# Patient Record
Sex: Male | Born: 1975
Health system: Southern US, Community
[De-identification: ages and names within clinical notes are randomized; demographics above are authoritative.]

## PROBLEM LIST (undated history)

## (undated) DIAGNOSIS — Z91030 Bee allergy status: Secondary | ICD-10-CM

## (undated) DIAGNOSIS — I10 Essential (primary) hypertension: Secondary | ICD-10-CM

## (undated) DIAGNOSIS — E785 Hyperlipidemia, unspecified: Secondary | ICD-10-CM

## (undated) DIAGNOSIS — K219 Gastro-esophageal reflux disease without esophagitis: Secondary | ICD-10-CM

## (undated) DIAGNOSIS — J302 Other seasonal allergic rhinitis: Secondary | ICD-10-CM

## (undated) DIAGNOSIS — I214 Non-ST elevation (NSTEMI) myocardial infarction: Secondary | ICD-10-CM

## (undated) DIAGNOSIS — F419 Anxiety disorder, unspecified: Secondary | ICD-10-CM

## (undated) DIAGNOSIS — I2510 Atherosclerotic heart disease of native coronary artery without angina pectoris: Secondary | ICD-10-CM

## (undated) HISTORY — DX: Gastro-esophageal reflux disease without esophagitis: K21.9

## (undated) HISTORY — DX: Atherosclerotic heart disease of native coronary artery without angina pectoris: I25.10

## (undated) HISTORY — DX: Other seasonal allergic rhinitis: J30.2

## (undated) HISTORY — DX: Anxiety disorder, unspecified: F41.9

## (undated) HISTORY — DX: Hyperlipidemia, unspecified: E78.5

## (undated) HISTORY — DX: Essential (primary) hypertension: I10

## (undated) HISTORY — DX: Non-ST elevation (NSTEMI) myocardial infarction: I21.4

---

## 2001-05-21 ENCOUNTER — Emergency Department (HOSPITAL_COMMUNITY): Admission: EM | Admit: 2001-05-21 | Discharge: 2001-05-21 | Payer: Self-pay | Admitting: Emergency Medicine

## 2004-03-29 ENCOUNTER — Emergency Department (HOSPITAL_COMMUNITY): Admission: EM | Admit: 2004-03-29 | Discharge: 2004-03-29 | Payer: Self-pay | Admitting: Emergency Medicine

## 2004-04-04 ENCOUNTER — Ambulatory Visit (HOSPITAL_COMMUNITY): Admission: RE | Admit: 2004-04-04 | Discharge: 2004-04-04 | Payer: Self-pay | Admitting: Family Medicine

## 2007-11-12 ENCOUNTER — Ambulatory Visit (HOSPITAL_COMMUNITY): Admission: RE | Admit: 2007-11-12 | Discharge: 2007-11-12 | Payer: Self-pay | Admitting: Family Medicine

## 2007-12-24 ENCOUNTER — Ambulatory Visit: Payer: Self-pay | Admitting: Cardiovascular Disease

## 2008-03-08 ENCOUNTER — Emergency Department (HOSPITAL_COMMUNITY): Admission: EM | Admit: 2008-03-08 | Discharge: 2008-03-08 | Payer: Self-pay | Admitting: Emergency Medicine

## 2008-03-24 ENCOUNTER — Ambulatory Visit: Payer: Self-pay | Admitting: Cardiovascular Disease

## 2008-03-24 DIAGNOSIS — F411 Generalized anxiety disorder: Secondary | ICD-10-CM | POA: Insufficient documentation

## 2008-03-24 DIAGNOSIS — E785 Hyperlipidemia, unspecified: Secondary | ICD-10-CM | POA: Insufficient documentation

## 2008-03-24 DIAGNOSIS — I059 Rheumatic mitral valve disease, unspecified: Secondary | ICD-10-CM | POA: Insufficient documentation

## 2008-03-24 DIAGNOSIS — I10 Essential (primary) hypertension: Secondary | ICD-10-CM

## 2008-06-25 ENCOUNTER — Emergency Department (HOSPITAL_COMMUNITY): Admission: EM | Admit: 2008-06-25 | Discharge: 2008-06-25 | Payer: Self-pay | Admitting: Emergency Medicine

## 2008-08-10 ENCOUNTER — Ambulatory Visit (HOSPITAL_COMMUNITY): Admission: RE | Admit: 2008-08-10 | Discharge: 2008-08-10 | Payer: Self-pay | Admitting: Orthopedic Surgery

## 2008-10-06 ENCOUNTER — Encounter (INDEPENDENT_AMBULATORY_CARE_PROVIDER_SITE_OTHER): Payer: Self-pay | Admitting: *Deleted

## 2009-04-19 ENCOUNTER — Emergency Department (HOSPITAL_COMMUNITY): Admission: EM | Admit: 2009-04-19 | Discharge: 2009-04-19 | Payer: Self-pay | Admitting: Emergency Medicine

## 2010-01-26 ENCOUNTER — Emergency Department (HOSPITAL_COMMUNITY): Admission: EM | Admit: 2010-01-26 | Discharge: 2009-11-07 | Payer: Self-pay | Admitting: Emergency Medicine

## 2010-03-12 ENCOUNTER — Encounter: Payer: Self-pay | Admitting: Family Medicine

## 2010-07-04 NOTE — Assessment & Plan Note (Signed)
[facility]                            CARDIOLOGY OFFICE NOTE   NAME:Knoedler, [name]                         MRN:          [id number]  DATE:03/24/2008                            DOB:          10/12/1975    Ryan [name], Ryan [name], Ryan [name], Ryan mitral valve prolapse.   He has been doing well.  He continues to work in the [employer] with his father.  His weight continues to be high.   He had a blood pressure record with him Ryan continues to run systolics  of 131-150.   Since he has not lost weight, I told Ryan that he would probably need to  start blood pressure medicine.  He is okay with this.  We are going to  over low-sodium diet.   The patient has recently had bronchitis Ryan URI.  He has been seeing Dr.  Phillips Odor for this.  He continues to have some congestion.  For some  reason, they told him to stop his Crestor while he was taking a Z-Pak  Ryan some codeine.  I am not sure why.  He needs to be on cholesterol  medicine.   The patient was requesting something less expensive than Crestor.   REVIEW OF SYSTEMS:  Remarkable for congestion Ryan a cough from his URI.  He has not had any significant myalgias.  No headaches, no TIA, or CVA.  No lower extremity edema, chest pain, PND or orthopnea, Ryan only mild  shortness of breath from his URI.   CURRENT MEDICATIONS:  1. Fish oil.  2. Vitamin C.  3. B6.  4. B12.  5. Vitamin E.  6. Crestor 5 to be stopped.  7. Fexofenadine.  8. Aspirin.   PHYSICAL EXAMINATION:  GENERAL:  Remarkable for an overweight male in no  distress.  VITAL SIGNS:  His blood pressure is 150/84; weight 189; pulse 88,  afebrile; Ryan respiratory rate 14.  HEENT:  Unremarkable.  NECK:  Carotids are normal without bruit.  No lymphadenopathy,  thyromegaly, or JVP elevation.  LUNGS:  Clear, good diaphragmatic motion.  No wheezing.  CARDIAC:  S1 Ryan S2.   Normal heart sounds.  PMI normal.  ABDOMEN:  Benign.  Bowel sounds positive.  No AAA.  No tenderness.  No  bruit.  No hepatosplenomegaly.  No hepatojugular reflux.  EXTREMITIES:  Distal pulses are intact.  No edema.  NEURO:  Nonfocal.  SKIN:  Warm Ryan dry.  MUSCULOSKELETAL:  No muscular weakness.   IMPRESSION:  1. Recent bronchitis Ryan upper respiratory illness.  Continue      supportive care with decongestions.  Follow up with Dr. Phillips Odor.  2. Ryan [name].  Start lisinopril 10 mg a day, low-sodium diet.  3. [name].  Switch to Zocor 20 mg a day.  I asked him to      start his lisinopril first Ryan after 4 weeks if he had no side      effects, then restart his cholesterol medicine.  4. Ryan [name], seems improved.  Continue behavioral modifications.  No  need for selective serotonin reuptake inhibitors at this point.  5. History of mitral valve prolapse which revealed mild mitral      regurgitation.  Consider followup echo in November.  No need for      subacute bacterial endocarditis prophylaxis.     Noralyn Pick. Eden Emms, MD, [facility]  Electronically Signed    PCN/MedQ  DD: 03/24/2008  DT: 03/24/2008  Job #: [id number]

## 2010-07-04 NOTE — Assessment & Plan Note (Signed)
HEALTHCARE                            CARDIOLOGY OFFICE NOTE   NAME:[name], [name]                         MRN:          [id number]  DATE:12/24/2007                            DOB:          [birth year]    Mr. Ryan [name] seen today as a new patient at the request of Dr. Phillips Odor.  He  is 35 years old.  I have taken care of his father for a long time, who  has had bypass surgery.  Ryan is extremely worried about his risk  factors.  He was recently seen by [facility].  He did  not particularly get along with [facility].  I read through all of  the notes from [facility].   The patient is requesting a second opinion regarding an abnormal echo  with mitral valve disease, hypertension, and hypercholesterolemia.   In talking to Ryan, he is fairly asymptomatic.  He is not having chest  pain, PND, or orthopnea.  He is overweight.  He is not particularly  active with his exercising.  He was started on Crestor 5 mg a day by Dr.  Domingo Sep about 3 weeks ago.  He needs to have followup LFTs.  I told him  to get these done at Dr. Lamar Blinks office since he lives in [place]  and this is most convenient.  He is not having any myalgias or side  effects.   The patient's blood pressure has been borderline.  He needs to get a  blood pressure cuff and review his blood pressures at home.  It appears  that it was elevated at Dr. Roque Lias office and was elevated here  today.  I talked to him at length about his diet.  I told him that the  [facility] or ferment type diet would be appropriate for him.  He needs  to limit his sodium intake.   I explained to him in the long run that he probably would not need  cholesterol or hypertensive medicines if he could lose 10-15 pounds, be  more active, and watch his diet.   From a cardiovascular perspective, he had a stress Myoview study at Dr.  Roque Lias office.  The results were reviewed.  He  exercised 11 minutes  and achieved a maximum heart rate of 178.  EKG and Myoview images were  normal.   He also had a 2-D echocardiogram.  There was mild prolapse of the  anterior leaflet with trace mitral insufficiency.  I explained to Ryan  that this is not a clinically significant finding and that he did not  need SBE prophylaxis.   His EF on Myoview and echo were normal.   REVIEW OF SYSTEMS:  Otherwise negative.   PAST MEDICAL HISTORY:  Benign.  He has some allergies and some asthma.   The patient is happily married.  He had 2 children that are 87 and 92  years old.  His wife is quite organized regarding his medical care.  He  quit smoking in 2004.  He drinks once in a while, nothing regularly.  Father has premature coronary artery disease.  Mother and father are  still alive.  Father has started having coronary artery disease at age  46 and I have taken care of him for about 14 years.   CURRENT MEDICATIONS:  1. Fish oil.  2. Vitamin C, B6, and B12.  3. Crestor 5 a day.  4. Fexofenadine.  5. Xopenex.  6. Baby aspirin.   PHYSICAL EXAMINATION:  VITAL SIGNS:  Remarkable for blood pressure  130/85, pulse 70 and regular, respiratory rate 14, and afebrile.  Weight  181.  HEENT:  Unremarkable.  NECK:  Carotids are normal without bruit.  No lymphadenopathy,  thyromegaly, or JVP elevation.  LUNGS:  Clear.  Good diaphragmatic motion.  No wheezing.  CARDIAC:  S1 and S2.  Normal heart sounds.  PMI normal.  ABDOMEN:  Benign.  Bowel sounds positive.  No AAA.  No tenderness.  No  bruit.  No hepatosplenomegaly or hepatojugular reflux.  EXTREMITIES:  Distal pulses intact.  No edema.  NEURO:  Nonfocal.  SKIN:  Warm and dry.  No muscular weakness.   EKG shows sinus rhythm and is normal.  There is minor sinus arrhythmia.   IMPRESSION:  1. Anterior leaflet prolapse by echo, clinically not significant.  No      need for subacute bacterial endocarditis prophylaxis.  No murmur on       exam.  No need for followup echo.  2. Sinus arrhythmia on EKG.  Basically establishes normal autonomic      function.  No need for further followup.  3. Hypertension, borderline, low-sodium diet, blood pressure cuff      today, and monitor at home.  Follow up in 8 weeks to reassess.  4. Hypercholesterolemia.  I will have to get his lipid liver profile      that was done.  He is going to follow up with his LFTs at Dr.      Lamar Blinks office and then we will repeat his lipid profile on      therapy in January, currently not having side effects from Crestor.      Noralyn Pick. Eden Emms, MD, [facility]  Electronically Signed    PCN/MedQ  DD: 12/24/2007  DT: 12/24/2007  Job #: [id number]

## 2011-02-21 ENCOUNTER — Encounter: Payer: Self-pay | Admitting: Emergency Medicine

## 2011-02-21 ENCOUNTER — Emergency Department (HOSPITAL_COMMUNITY)
Admission: EM | Admit: 2011-02-21 | Discharge: 2011-02-22 | Disposition: A | Discharge status: Home / Self Care | Payer: BC Managed Care – PPO | Attending: Emergency Medicine | Admitting: Emergency Medicine

## 2011-02-21 DIAGNOSIS — IMO0002 Reserved for concepts with insufficient information to code with codable children: Secondary | ICD-10-CM | POA: Insufficient documentation

## 2011-02-21 DIAGNOSIS — X500XXA Overexertion from strenuous movement or load, initial encounter: Secondary | ICD-10-CM | POA: Insufficient documentation

## 2011-02-21 DIAGNOSIS — R071 Chest pain on breathing: Secondary | ICD-10-CM | POA: Insufficient documentation

## 2011-02-21 DIAGNOSIS — S29011A Strain of muscle and tendon of front wall of thorax, initial encounter: Secondary | ICD-10-CM

## 2011-02-21 DIAGNOSIS — R1011 Right upper quadrant pain: Secondary | ICD-10-CM | POA: Insufficient documentation

## 2011-02-21 NOTE — ED Notes (Signed)
Felt something "pull" right rib area after sneezing episode last night.  Progressively worse

## 2011-02-22 MED ORDER — KETOROLAC TROMETHAMINE 60 MG/2ML IM SOLN
60.0000 mg | Freq: Once | INTRAMUSCULAR | Status: AC
  Administered 2011-02-22: 60 mg via INTRAMUSCULAR
  Filled 2011-02-22: qty 2

## 2011-02-22 MED ORDER — NAPROXEN 500 MG PO TABS: 500.0000 mg | ORAL_TABLET | Freq: Two times a day (BID) | ORAL | Status: AC

## 2011-02-22 NOTE — ED Provider Notes (Signed)
History     CSN: [id number]  Arrival date & time 02/21/11  2051   First MD Initiated Contact with Patient 02/22/11 0018      Chief Complaint  Patient presents with  . Chest Pain    (Consider location/radiation/quality/duration/timing/severity/associated sxs/prior treatment) HPI Comments: 36 year old male with a history of a sneezing fit that occurred last night. He states that he sneezed approximately 14 or 15 times in a row,   Immediately upon the last sneeze he develops severe pain in his right upper abdomen and lower chest wall. This pain is intermittent, only occurs with sneezing, coughing, deep breathing and movement in the bed. If he holds perfectly still he has no pain. Symptoms are 0/10 at this time, 8/10 with taking big breath or pushing on her right upper quadrant, lower chest. He denies fevers, chills, nausea, vomiting, swelling, rash he has taken ibuprofen prior to arrival with no improvement.  Patient is a 36 y.o. male presenting with chest pain. The history is provided by the patient.  Chest Pain     History reviewed. No pertinent past medical history.  History reviewed. No pertinent past surgical history.  No family history on file.  History  Substance Use Topics  . Smoking status: Never Smoker   . Smokeless tobacco: Not on file  . Alcohol Use: No      Review of Systems  Cardiovascular: Positive for chest pain.  All other systems reviewed and are negative.    Allergies  Review of patient's allergies indicates no known allergies.  Home Medications   Current Outpatient Rx  Name Route Sig Dispense Refill  . IBUPROFEN 200 MG PO TABS Oral Take 400 mg by mouth 2 (two) times daily as needed. For pain     . TETRAHYDROZOLINE HCL 0.05 % OP SOLN Both Eyes Place 1 drop into both eyes daily as needed.      Marland Kitchen NAPROXEN 500 MG PO TABS Oral Take 1 tablet (500 mg total) by mouth 2 (two) times daily with a meal. 30 tablet 0    BP 155/95  Pulse 71  Temp(Src) 97.7  F (36.5 C) (Oral)  Resp 20  Ht 5\' 6"  (1.676 m)  Wt 180 lb (81.647 kg)  BMI 29.05 kg/m2  SpO2 99%  Physical Exam  Nursing note and vitals reviewed. Constitutional: He appears well-developed and well-nourished. No distress.  HENT:  Head: Normocephalic and atraumatic.  Mouth/Throat: Oropharynx is clear and moist. No oropharyngeal exudate.  Eyes: Conjunctivae and EOM are normal. Pupils are equal, round, and reactive to light. Right eye exhibits no discharge. Left eye exhibits no discharge. No scleral icterus.  Neck: Normal range of motion. Neck supple. No JVD present. No thyromegaly present.  Cardiovascular: Normal rate, regular rhythm, normal heart sounds and intact distal pulses.  Exam reveals no gallop and no friction rub.   No murmur heard. Pulmonary/Chest: Effort normal and breath sounds normal. No respiratory distress. He has no wheezes. He has no rales. He exhibits tenderness ( tenderness over the right lower eighth ninth and 10th ribs anteriorly.).  Abdominal: Soft. Bowel sounds are normal. He exhibits no distension and no mass. There is tenderness ( tender to palpation over the right costal margin, anterior axillary line and anteriorly).  Musculoskeletal: Normal range of motion. He exhibits no edema and no tenderness.  Lymphadenopathy:    He has no cervical adenopathy.  Neurological: He is alert. Coordination normal.  Skin: Skin is warm and dry. No rash noted. No erythema.  Psychiatric:  He has a normal mood and affect. His behavior is normal.    ED Course  Procedures (including critical care time)  Labs Reviewed - No data to display No results found.   1. Muscle strain of chest wall       MDM  No crepitance, no deformity, no pain at rest. Patient was very comfortable when resting. No obvious signs of fracture and in this otherwise healthy young man I doubt fracture from sneezing. We'll treat aggressively for muscle strain at this area with intramuscular Toradol, home  with medications and since at followup.        Vida Roller, MD 02/22/11 [phone]

## 2011-02-22 NOTE — ED Notes (Signed)
Pt reports being at home last night when he "went into a sneezing fit" and pt reports that he began to experience right sided abdominal pain. Pt reports taking 2 Ibuprofen with little relief but reports that the pain came back within 10 minutes. Pt reports getting up this morning and taking 2 more Ibuprofen with little relief. Pt denies any trauma to his side. Pt denies any nausea or vomiting. Pt states that the pain is intermittent non radiating pain 4/10. Pt reports only experiencing the pain when he coughs, laughs, or sneezes.

## 2011-05-05 ENCOUNTER — Encounter (HOSPITAL_COMMUNITY): Payer: Self-pay | Admitting: *Deleted

## 2011-05-05 ENCOUNTER — Emergency Department (HOSPITAL_COMMUNITY)
Admission: EM | Admit: 2011-05-05 | Discharge: 2011-05-05 | Disposition: A | Discharge status: Home / Self Care | Payer: BC Managed Care – PPO | Attending: Emergency Medicine | Admitting: Emergency Medicine

## 2011-05-05 DIAGNOSIS — R51 Headache: Secondary | ICD-10-CM | POA: Insufficient documentation

## 2011-05-05 DIAGNOSIS — R110 Nausea: Secondary | ICD-10-CM | POA: Insufficient documentation

## 2011-05-05 DIAGNOSIS — J020 Streptococcal pharyngitis: Secondary | ICD-10-CM | POA: Insufficient documentation

## 2011-05-05 LAB — RAPID STREP SCREEN (MED CTR MEBANE ONLY): Streptococcus, Group A Screen (Direct): POSITIVE — AB

## 2011-05-05 MED ORDER — IBUPROFEN 800 MG PO TABS
800.0000 mg | ORAL_TABLET | Freq: Once | ORAL | Status: AC
  Administered 2011-05-05: 800 mg via ORAL
  Filled 2011-05-05: qty 1

## 2011-05-05 MED ORDER — PENICILLIN G BENZATHINE 1200000 UNIT/2ML IM SUSP
1.2000 10*6.[IU] | Freq: Once | INTRAMUSCULAR | Status: AC
  Administered 2011-05-05: 1.2 10*6.[IU] via INTRAMUSCULAR
  Filled 2011-05-05: qty 2

## 2011-05-05 MED ORDER — HYDROCODONE-ACETAMINOPHEN 5-325 MG PO TABS
1.0000 | ORAL_TABLET | Freq: Once | ORAL | Status: AC
  Administered 2011-05-05: 1 via ORAL
  Filled 2011-05-05: qty 1

## 2011-05-05 MED ORDER — HYDROCODONE-ACETAMINOPHEN 5-325 MG PO TABS: 1.0000 | ORAL_TABLET | ORAL | Status: AC | PRN

## 2011-05-05 NOTE — Discharge Instructions (Signed)

## 2011-05-05 NOTE — ED Provider Notes (Signed)
History     CSN: [id number]  Arrival date & time 05/05/11  1418   First MD Initiated Contact with Patient 05/05/11 1654      Chief Complaint  Patient presents with  . Fever  . Sore Throat    (Consider location/radiation/quality/duration/timing/severity/associated sxs/prior treatment) Patient is a 36 y.o. male presenting with fever and pharyngitis. The history is provided by the patient.  Fever Primary symptoms of the febrile illness include fever, fatigue, headaches and nausea. Primary symptoms do not include cough, wheezing, shortness of breath, abdominal pain, dysuria or arthralgias. The current episode started yesterday. This is a new problem. The problem has been gradually worsening.  The headache is not associated with photophobia.  Associated with: nothing. Risk factors: no risk factors. Sore Throat Associated symptoms include fatigue, a fever, headaches and nausea. Pertinent negatives include no abdominal pain, arthralgias, chest pain, coughing or neck pain.    History reviewed. No pertinent past medical history.  History reviewed. No pertinent past surgical history.  No family history on file.  History  Substance Use Topics  . Smoking status: Never Smoker   . Smokeless tobacco: Not on file  . Alcohol Use: No      Review of Systems  Constitutional: Positive for fever and fatigue. Negative for activity change.       All ROS Neg except as noted in HPI  HENT: Negative for nosebleeds and neck pain.   Eyes: Negative for photophobia and discharge.  Respiratory: Negative for cough, shortness of breath and wheezing.   Cardiovascular: Negative for chest pain and palpitations.  Gastrointestinal: Positive for nausea. Negative for abdominal pain and blood in stool.  Genitourinary: Negative for dysuria, frequency and hematuria.  Musculoskeletal: Negative for back pain and arthralgias.  Skin: Negative.   Neurological: Positive for headaches. Negative for dizziness,  seizures and speech difficulty.  Psychiatric/Behavioral: Negative for hallucinations and confusion.    Allergies  Review of patient's allergies indicates no known allergies.  Home Medications   Current Outpatient Rx  Name Route Sig Dispense Refill  . HYDROCODONE-ACETAMINOPHEN 5-325 MG PO TABS Oral Take 1 tablet by mouth every 4 (four) hours as needed for pain. 15 tablet 0  . IBUPROFEN 200 MG PO TABS Oral Take 400 mg by mouth 2 (two) times daily as needed. For pain     . NAPROXEN 500 MG PO TABS Oral Take 1 tablet (500 mg total) by mouth 2 (two) times daily with a meal. 30 tablet 0  . TETRAHYDROZOLINE HCL 0.05 % OP SOLN Both Eyes Place 1 drop into both eyes daily as needed.        BP 153/91  Pulse 115  Temp(Src) 100.4 F (38 C) (Oral)  Resp 20  Ht 5\' 6"  (1.676 m)  Wt 174 lb (78.926 kg)  BMI 28.08 kg/m2  SpO2 96%  Physical Exam  Nursing note and vitals reviewed. Constitutional: He is oriented to person, place, and time. He appears well-developed and well-nourished.  Non-toxic appearance.  HENT:  Head: Normocephalic.  Right Ear: Tympanic membrane and external ear normal.  Left Ear: Tympanic membrane and external ear normal.  Mouth/Throat: Uvula swelling present. Oropharyngeal exudate present. No tonsillar abscesses.       Tonsils swollen with increase redness. Speech understandable.  Eyes: EOM and lids are normal. Pupils are equal, round, and reactive to light.  Neck: Normal range of motion. Neck supple. Carotid bruit is not present.  Cardiovascular: Normal rate, regular rhythm, normal heart sounds, intact distal pulses and normal  pulses.   Pulmonary/Chest: Breath sounds normal. No respiratory distress.  Abdominal: Soft. Bowel sounds are normal. There is no tenderness. There is no guarding.  Musculoskeletal: Normal range of motion.  Lymphadenopathy:       Head (right side): No submandibular adenopathy present.       Head (left side): No submandibular adenopathy present.    He  has no cervical adenopathy.  Neurological: He is alert and oriented to person, place, and time. He has normal strength. No cranial nerve deficit or sensory deficit.  Skin: Skin is warm and dry.  Psychiatric: He has a normal mood and affect. His speech is normal.    ED Course  Procedures (including critical care time)  Labs Reviewed  RAPID STREP SCREEN - Abnormal; Notable for the following:    Streptococcus, Group A Screen (Direct) POSITIVE (*)    All other components within normal limits   No results found.   1. Strep pharyngitis       MDM  I have reviewed nursing notes, vital signs, and all appropriate lab and imaging results for this patient. Strep test is POS. Pt elected to receive IM Bicillin. Use of mask and washing hands discussed with patient. Ibuprofen or tylenol for fever and aching. Rx for norco given.       Kathie Dike, [place] 05/05/11 1701

## 2011-05-05 NOTE — ED Notes (Signed)
Fever, sore throat, body aches.

## 2011-05-06 NOTE — ED Provider Notes (Signed)
Medical screening examination/treatment/procedure(s) were performed by non-physician practitioner and as supervising physician I was immediately available for consultation/collaboration.   Knute Mazzuca, MD 05/06/11 1510 

## 2011-05-29 ENCOUNTER — Emergency Department (HOSPITAL_COMMUNITY): Payer: BC Managed Care – PPO

## 2011-05-29 ENCOUNTER — Emergency Department (HOSPITAL_COMMUNITY)
Admission: EM | Admit: 2011-05-29 | Discharge: 2011-05-29 | Disposition: A | Discharge status: Home / Self Care | Payer: BC Managed Care – PPO | Attending: Emergency Medicine | Admitting: Emergency Medicine

## 2011-05-29 ENCOUNTER — Encounter (HOSPITAL_COMMUNITY): Payer: Self-pay

## 2011-05-29 DIAGNOSIS — M62838 Other muscle spasm: Secondary | ICD-10-CM | POA: Insufficient documentation

## 2011-05-29 DIAGNOSIS — M545 Low back pain, unspecified: Secondary | ICD-10-CM | POA: Insufficient documentation

## 2011-05-29 DIAGNOSIS — M549 Dorsalgia, unspecified: Secondary | ICD-10-CM | POA: Insufficient documentation

## 2011-05-29 MED ORDER — METHOCARBAMOL 750 MG PO TABS: ORAL_TABLET | ORAL | Status: DC

## 2011-05-29 MED ORDER — IBUPROFEN 800 MG PO TABS
800.0000 mg | ORAL_TABLET | Freq: Once | ORAL | Status: AC
  Administered 2011-05-29: 800 mg via ORAL
  Filled 2011-05-29: qty 1

## 2011-05-29 MED ORDER — METHOCARBAMOL 500 MG PO TABS
1500.0000 mg | ORAL_TABLET | Freq: Once | ORAL | Status: AC
  Administered 2011-05-29: 1500 mg via ORAL
  Filled 2011-05-29: qty 3

## 2011-05-29 NOTE — Discharge Instructions (Signed)
Back Pain, Adult Back pain is very common. The pain often gets better over time. The cause of back pain is usually not dangerous. Most people can learn to manage their back pain on their own.  HOME CARE   Stay active. Start with short walks on flat ground if you can. Try to walk farther each day.   Do not sit, drive, or stand in one place for more than 30 minutes. Do not stay in bed.   Do not avoid exercise or work. Activity can help your back heal faster.   Be careful when you bend or lift an object. Bend at your knees, keep the object close to you, and do not twist.   Sleep on a firm mattress. Lie on your side, and bend your knees. If you lie on your back, put a pillow under your knees.   Only take medicines as told by your doctor.   Put ice on the injured area.   Put ice in a plastic bag.   Place a towel between your skin and the bag.   Leave the ice on for 15 to 20 minutes, 3 to 4 times a day for the first 2 to 3 days. After that, you can switch between ice and heat packs.   Ask your doctor about back exercises or massage.   Avoid feeling anxious or stressed. Find good ways to deal with stress, such as exercise.  GET HELP RIGHT AWAY IF:   Your pain does not go away with rest or medicine.   Your pain does not go away in 1 week.   You have new problems.   You do not feel well.   The pain spreads into your legs.   You cannot control when you poop (bowel movement) or pee (urinate).   Your arms or legs feel weak or lose feeling (numbness).   You feel sick to your stomach (nauseous) or throw up (vomit).   You have belly (abdominal) pain.   You feel like you may pass out (faint).  MAKE SURE YOU:   Understand these instructions.   Will watch your condition.   Will get help right away if you are not doing well or get worse.  Document Released: 07/25/2007 Document Revised: 01/25/2011 Document Reviewed: 06/26/2010 [facility] Patient Information 2012 [place],  [place].Muscle Cramps Muscle cramps are due to sudden involuntary muscle contraction. This means you have no control over the tightening of a muscle (or muscles). Often there are no obvious causes. Muscle cramps may occur with overexertion. They may also occur with chilling of the muscles. An example of a muscle chilling activity is swimming. It is uncommon for cramps to be due to a serious underlying disorder. In most cases, muscle cramps improve (or leave) within minutes. CAUSES  Some common causes are:  Injury.   Infections, especially viral.   Abnormal levels of the salts and ions in your blood (electrolytes). This could happen if you are taking water pills (diuretics).   Blood vessel disease where not enough blood is getting to the muscles (intermittent claudication).  Some uncommon causes are:  Side effects of some medicine (such as lithium).   Alcohol abuse.   Diseases where there is soreness (inflammation) of the muscular system.  HOME CARE INSTRUCTIONS   It may be helpful to massage, stretch, and relax the affected muscle.   Taking a dose of over-the-counter diphenhydramine is helpful for night leg cramps.  SEEK MEDICAL CARE IF:  Cramps are frequent and not  relieved with medicine. MAKE SURE YOU:   Understand these instructions.   Will watch your condition.   Will get help right away if you are not doing well or get worse.  Document Released: 07/28/2001 Document Revised: 01/25/2011 Document Reviewed: 01/28/2008 [facility] Patient Information 2012 [place], [place].    Take the meds as directed.  Take ibuprofen 800 mg every 8 hrs with food.  Follow up with your MD or dr. Romeo Apple as needed.

## 2011-05-29 NOTE — ED Provider Notes (Signed)
History     CSN: [id number]  Arrival date & time 05/29/11  1731   First MD Initiated Contact with Patient 05/29/11 2001      Chief Complaint  Patient presents with  . Back Pain    (Consider location/radiation/quality/duration/timing/severity/associated sxs/prior treatment) HPI Comments: Notes large knots in lower back for a couple weeks that are pain especially with movement.  His wife massages them and they "go away".  No trauma.  The history is provided by the patient and the spouse. No language interpreter was used.    History reviewed. No pertinent past medical history.  History reviewed. No pertinent past surgical history.  No family history on file.  History  Substance Use Topics  . Smoking status: Never Smoker   . Smokeless tobacco: Not on file  . Alcohol Use: No      Review of Systems  Constitutional: Negative for fever.  Musculoskeletal: Positive for back pain.  All other systems reviewed and are negative.    Allergies  Bee venom  Home Medications   Current Outpatient Rx  Name Route Sig Dispense Refill  . IBUPROFEN 200 MG PO TABS Oral Take 400 mg by mouth 2 (two) times daily as needed. For pain     . NAPROXEN 500 MG PO TABS Oral Take 1 tablet (500 mg total) by mouth 2 (two) times daily with a meal. 30 tablet 0  . TETRAHYDROZOLINE HCL 0.05 % OP SOLN Both Eyes Place 1 drop into both eyes daily as needed.        BP 128/75  Pulse 65  Temp(Src) 97.6 F (36.4 C) (Oral)  Resp 20  Ht 5\' 6"  (1.676 m)  Wt 175 lb (79.379 kg)  BMI 28.25 kg/m2  SpO2 98%  Physical Exam  Nursing note and vitals reviewed. Constitutional: He is oriented to person, place, and time. He appears well-developed and well-nourished.  HENT:  Head: Normocephalic and atraumatic.  Eyes: EOM are normal.  Neck: Normal range of motion.  Cardiovascular: Normal rate, regular rhythm, normal heart sounds and intact distal pulses.   Pulmonary/Chest: Effort normal and breath sounds normal.  No respiratory distress.  Abdominal: Soft. He exhibits no distension. There is no tenderness.  Musculoskeletal: He exhibits tenderness.       Arms: Neurological: He is alert and oriented to person, place, and time.  Skin: Skin is warm and dry.  Psychiatric: He has a normal mood and affect. Judgment normal.    ED Course  Procedures (including critical care time)  Labs Reviewed - No data to display Dg Lumbar Spine Complete  05/29/2011  *RADIOLOGY REPORT*  Clinical Data: Lower back pain.  LUMBAR SPINE - COMPLETE 4+ VIEW  Comparison: None.  Findings: AP, lateral and oblique images of the lumbar spine were obtained.  Normal alignment of the lumbar spine.  No evidence for fracture or dislocation.  Vertebral body heights are maintained.  IMPRESSION: Normal lumbar spine exam.  Original Report Authenticated By: Richarda Overlie, M.D.     1. Muscle spasm       MDM  The x-rays are normal rx-robaxin 750 mg, 1-2 po QID, 40 OTC ibuprofen 800 TID        Worthy Rancher, PA 05/29/11 2125  Worthy Rancher, PA 05/29/11 2133

## 2011-05-29 NOTE — ED Notes (Signed)
Pt reports has noticed painful knots under his skin in lower back.  Says has had this happen before but they went away on their own.   Denies pain radiating down legs.

## 2011-05-30 NOTE — ED Provider Notes (Signed)
Medical screening examination/treatment/procedure(s) were performed by non-physician practitioner and as supervising physician I was immediately available for consultation/collaboration.  Tiye Huwe, MD 05/30/11 1714 

## 2012-08-15 ENCOUNTER — Emergency Department (HOSPITAL_COMMUNITY): Payer: Managed Care, Other (non HMO)

## 2012-08-15 ENCOUNTER — Emergency Department (HOSPITAL_COMMUNITY)
Admission: EM | Admit: 2012-08-15 | Discharge: 2012-08-15 | Discharge status: Against Medical Advice | Payer: Managed Care, Other (non HMO) | Attending: Emergency Medicine | Admitting: Emergency Medicine

## 2012-08-15 ENCOUNTER — Encounter (HOSPITAL_COMMUNITY): Payer: Self-pay | Admitting: *Deleted

## 2012-08-15 DIAGNOSIS — R079 Chest pain, unspecified: Secondary | ICD-10-CM | POA: Insufficient documentation

## 2012-08-15 DIAGNOSIS — Z87891 Personal history of nicotine dependence: Secondary | ICD-10-CM | POA: Insufficient documentation

## 2012-08-15 DIAGNOSIS — Z79899 Other long term (current) drug therapy: Secondary | ICD-10-CM | POA: Insufficient documentation

## 2012-08-15 LAB — BASIC METABOLIC PANEL
Chloride: 100 mEq/L (ref 96–112)
GFR calc Af Amer: >90 mL/min (ref >90)
Potassium: 3.9 mEq/L (ref 3.5–5.1)

## 2012-08-15 LAB — CBC WITH DIFFERENTIAL/PLATELET
Basophils Absolute: 0.1 10*3/uL (ref 0.0–0.1)
Basophils Relative: 1 % (ref 0–1)
Hemoglobin: 15.5 g/dL (ref 13.0–17.0)
MCHC: 35.1 g/dL (ref 30.0–36.0)
Neutro Abs: 5.5 10*3/uL (ref 1.7–7.7)
Neutrophils Relative %: 54 % (ref 43–77)
RDW: 12.4 % (ref 11.5–15.5)
WBC: 10.2 10*3/uL (ref 4.0–10.5)

## 2012-08-15 LAB — TROPONIN I: Troponin I: <0.3 ng/mL (ref <0.30)

## 2012-08-15 NOTE — ED Notes (Addendum)
Chest pain, onset 15 min pta while watching tv, "squeezing" pain. Pt says he has an URI  , taking a z-pack for the URI.

## 2012-08-15 NOTE — ED Notes (Signed)
Pt states he needs to leave due to something going on with his daughter. Pt signed AMA form. Dr. Adriana Simas notified.

## 2012-08-15 NOTE — ED Notes (Signed)
ekg done in triage and given to DR. Adriana Simas

## 2012-08-15 NOTE — ED Notes (Signed)
MD at bedside. 

## 2012-08-15 NOTE — ED Provider Notes (Signed)
History  This chart was scribed for Donnetta Hutching, MD by Ardelia Mems, ED Scribe. This patient was seen in room APA09/APA09 and the patient's care was started at 10:25 PM.  CSN: [id number]  Arrival date & time 08/15/12  2047   Chief Complaint  Patient presents with  . Chest Pain    The history is provided by the patient. No language interpreter was used.   HPI Comments: Ryan [name] is a 37 y.o. male who presents to the Emergency Department complaining of a 45 minute episode of "cramping", "squeezing" chest pain that radiated to his left shoulder onset about 1.5 hours ago while pt was sitting on his couch at home, drinking a [facility]. Pt states that his chest pain subsided after arrival while he was in the waiting room, and he now has only mild pressure in his chest. Pt states that his father has an extensive hx of heart problems, with his first massive MI at age 20. Pt also states that his grandfather died of a heart attack. Pt states that he has slighty elevated cholesterol, and was prescribed a statin drug, which he elected to discontinue because of undesirable side effects. Pt states that he is now taking fish oil on a daily basis instead. Pt states that he currently has an upper respiratory infection, for which he is taking azithromycin. Pt also states that he is in the process of separating from his wife which has caused elevated stress. Pt states that he does not take Aspirin on a daily basis. Pt states that he works as a Production designer, theatre/television/film at [employer] and he has to work tomorrow at 7 am. Pt denies SOB, diaphoresis, nausea, vomiting, weakness, numbness, abdominal pain, back pain or any other symptoms. Pt states that he is a former smoker who quit 2-3 years ago.  PCP- None   History reviewed. No pertinent past medical history.  History reviewed. No pertinent past surgical history.  History reviewed. No pertinent family history.  History  Substance Use Topics  . Smoking status: Never  Smoker   . Smokeless tobacco: Not on file  . Alcohol Use: No    Review of Systems  Constitutional: Negative for fever and diaphoresis.  HENT: Negative for congestion, sore throat and rhinorrhea.   Eyes: Negative for visual disturbance.  Respiratory: Negative for shortness of breath.   Cardiovascular: Positive for chest pain. Negative for leg swelling.  Gastrointestinal: Negative for nausea, vomiting, abdominal pain and diarrhea.  Genitourinary: Negative for dysuria.  Musculoskeletal: Negative for back pain.  Skin: Negative for rash.  Neurological: Negative for weakness and numbness.  A complete 10 system review of systems was obtained and all systems are negative except as noted in the HPI and PMH.   Allergies  Bee venom  Home Medications   Current Outpatient Rx  Name  Route  Sig  Dispense  Refill  . albuterol (PROVENTIL HFA;VENTOLIN HFA) 108 (90 BASE) MCG/ACT inhaler   Inhalation   Inhale 2 puffs into the lungs every 6 (six) hours as needed for wheezing or shortness of breath.         Marland Kitchen ibuprofen (ADVIL,MOTRIN) 200 MG tablet   Oral   Take 400 mg by mouth 2 (two) times daily as needed. For pain          . tetrahydrozoline (VISINE) 0.05 % ophthalmic solution   Both Eyes   Place 1 drop into both eyes daily as needed (for contacts).  Triage Vitals: BP 153/99  Pulse 80  Temp(Src) 98.1 F (36.7 C) (Oral)  Resp 20  Ht 5\' 6"  (1.676 m)  Wt 177 lb (80.287 kg)  BMI 28.58 kg/m2  SpO2 97%  Physical Exam  Nursing note and vitals reviewed. Constitutional: He is oriented to person, place, and time. He appears well-developed and well-nourished.  HENT:  Head: Normocephalic and atraumatic.  Eyes: Conjunctivae and EOM are normal. Pupils are equal, round, and reactive to light.  Neck: Normal [name] of motion. Neck supple.  Cardiovascular: Normal rate, regular rhythm and normal heart sounds.   Pulmonary/Chest: Effort normal and breath sounds normal.  Abdominal: Soft.  Bowel sounds are normal.  Musculoskeletal: Normal [name] of motion.  Neurological: He is alert and oriented to person, place, and time.  Skin: Skin is warm and dry.  Psychiatric: He has a normal mood and affect.    ED Course  Procedures (including critical care time)  DIAGNOSTIC STUDIES: Oxygen Saturation is 97% on RA, normal by my interpretation.    COORDINATION OF CARE: 10:36 PM- Discussed EKG, lab and radiology results, cardiac risk factors, ED blood glucose of 163 and reasons to come back to the ED with pt and pt is agreeable.   Labs Reviewed  BASIC METABOLIC PANEL - Abnormal; Notable for the following:    Glucose, Bld 163 (*)    GFR calc non Af Amer 86 (*)    All other components within normal limits  CBC WITH DIFFERENTIAL  TROPONIN I   Dg Chest 2 View  08/15/2012   *RADIOLOGY REPORT*  Clinical Data: Chest pain.  Radiating down left arm.  Family history of coronary artery disease.  CHEST - 2 VIEW  Comparison: 03/08/2008  Findings: Midline trachea.  Normal heart size and mediastinal contours.  No pleural effusion or pneumothorax.  Clear lungs.  IMPRESSION: Normal chest.   Original Report Authenticated By: Jeronimo Greaves, M.D.   No diagnosis found.    Date: 08/15/2012  Rate: 83  Rhythm: normal sinus rhythm  QRS Axis: normal  Intervals: normal  ST/T Wave abnormalities: normal  Conduction Disutrbances: none  Narrative Interpretation: unremarkable    MDM  Discuss cardiac risk factors with patient. Recommended second cardiac enzyme. Patient decided to leave AMA secondary to family problems.  He will consult a cardiologist or return if worse         I personally performed the services described in this documentation, which was scribed in my presence. The recorded information has been reviewed and is accurate.    Donnetta Hutching, MD 08/15/12 [phone]

## 2013-03-07 ENCOUNTER — Encounter (HOSPITAL_COMMUNITY): Payer: Self-pay | Admitting: Emergency Medicine

## 2013-03-07 ENCOUNTER — Emergency Department (HOSPITAL_COMMUNITY)
Admission: EM | Admit: 2013-03-07 | Discharge: 2013-03-07 | Disposition: A | Discharge status: Home / Self Care | Payer: 59 | Attending: Emergency Medicine | Admitting: Emergency Medicine

## 2013-03-07 ENCOUNTER — Emergency Department (HOSPITAL_COMMUNITY): Payer: 59

## 2013-03-07 DIAGNOSIS — Z791 Long term (current) use of non-steroidal anti-inflammatories (NSAID): Secondary | ICD-10-CM | POA: Insufficient documentation

## 2013-03-07 DIAGNOSIS — Z79899 Other long term (current) drug therapy: Secondary | ICD-10-CM | POA: Insufficient documentation

## 2013-03-07 DIAGNOSIS — R109 Unspecified abdominal pain: Secondary | ICD-10-CM

## 2013-03-07 DIAGNOSIS — R1011 Right upper quadrant pain: Secondary | ICD-10-CM | POA: Insufficient documentation

## 2013-03-07 LAB — HEPATIC FUNCTION PANEL
ALBUMIN: 5.2 g/dL (ref 3.5–5.2)
ALT: 25 U/L (ref 0–53)
AST: 20 U/L (ref 0–37)
Alkaline Phosphatase: 64 U/L (ref 39–117)
BILIRUBIN TOTAL: 0.5 mg/dL (ref 0.3–1.2)
Total Protein: 8.9 g/dL — ABNORMAL HIGH (ref 6.0–8.3)

## 2013-03-07 LAB — BASIC METABOLIC PANEL
BUN: 10 mg/dL (ref 6–23)
CHLORIDE: 99 meq/L (ref 96–112)
CO2: 28 mEq/L (ref 19–32)
Calcium: 10.1 mg/dL (ref 8.4–10.5)
Creatinine, Ser: 1.1 mg/dL (ref 0.50–1.35)
GFR, EST NON AFRICAN AMERICAN: 86 mL/min — AB (ref >90)
Glucose, Bld: 88 mg/dL (ref 70–99)
POTASSIUM: 3.8 meq/L (ref 3.7–5.3)
Sodium: 143 mEq/L (ref 137–147)

## 2013-03-07 LAB — LIPASE, BLOOD: Lipase: 32 U/L (ref 11–59)

## 2013-03-07 LAB — CBC
HCT: 50.7 % (ref 39.0–52.0)
Hemoglobin: 18.3 g/dL — ABNORMAL HIGH (ref 13.0–17.0)
MCH: 32 pg (ref 26.0–34.0)
MCHC: 36.1 g/dL — ABNORMAL HIGH (ref 30.0–36.0)
MCV: 88.6 fL (ref 78.0–100.0)
PLATELETS: 273 10*3/uL (ref 150–400)
RBC: 5.72 MIL/uL (ref 4.22–5.81)
RDW: 12.4 % (ref 11.5–15.5)
WBC: 11.9 10*3/uL — AB (ref 4.0–10.5)

## 2013-03-07 LAB — TROPONIN I

## 2013-03-07 MED ORDER — HYDROMORPHONE HCL PF 1 MG/ML IJ SOLN
1.0000 mg | Freq: Once | INTRAMUSCULAR | Status: AC
  Administered 2013-03-07: 1 mg via INTRAVENOUS
  Filled 2013-03-07: qty 1

## 2013-03-07 MED ORDER — OXYCODONE-ACETAMINOPHEN 5-325 MG PO TABS
2.0000 | ORAL_TABLET | ORAL | Status: DC | PRN
Start: 2013-03-07 — End: 2013-12-25

## 2013-03-07 MED ORDER — SODIUM CHLORIDE 0.9 % IV BOLUS (SEPSIS)
1000.0000 mL | Freq: Once | INTRAVENOUS | Status: AC
  Administered 2013-03-07: 1000 mL via INTRAVENOUS

## 2013-03-07 MED ORDER — PROMETHAZINE HCL 25 MG PO TABS: 25.0000 mg | ORAL_TABLET | Freq: Four times a day (QID) | ORAL | Status: DC | PRN

## 2013-03-07 MED ORDER — ONDANSETRON HCL 4 MG/2ML IJ SOLN
4.0000 mg | Freq: Once | INTRAMUSCULAR | Status: AC
  Administered 2013-03-07: 4 mg via INTRAVENOUS
  Filled 2013-03-07: qty 2

## 2013-03-07 NOTE — ED Notes (Signed)
Pt alert & oriented x4, stable gait. Patient given discharge instructions, paperwork & prescription(s). Patient  instructed to stop at the registration desk to finish any additional paperwork. Patient verbalized understanding. Pt left department w/ no further questions. 

## 2013-03-07 NOTE — ED Notes (Signed)
Reports sudden onset of severe sharp, stabbing pain to right lateral chest approx 1 hour ago.  Pt was at work making phone calls at onset.

## 2013-03-07 NOTE — ED Notes (Signed)
Pt has tenderness to rt upper abdomen, states pain started suddenly 1 hour prior to arrival

## 2013-03-07 NOTE — Discharge Instructions (Signed)
Abdominal Pain, Adult Many things can cause belly (abdominal) pain. Most times, the belly pain is not dangerous. Many cases of belly pain can be watched and treated at home. HOME CARE   Do not take medicines that help you go poop (laxatives) unless told to by your doctor.  Only take medicine as told by your doctor.  Eat or drink as told by your doctor. Your doctor will tell you if you should be on a special diet. GET HELP IF:  You do not know what is causing your belly pain.  You have belly pain while you are sick to your stomach (nauseous) or have runny poop (diarrhea).  You have pain while you pee or poop.  Your belly pain wakes you up at night.  You have belly pain that gets worse or better when you eat.  You have belly pain that gets worse when you eat fatty foods. GET HELP RIGHT AWAY IF:   The pain does not go away within 2 hours.  You have a fever.  You keep throwing up (vomiting).  The pain changes and is only in the right or left part of the belly.  You have bloody or tarry looking poop. MAKE SURE YOU:   Understand these instructions.  Will watch your condition.  Will get help right away if you are not doing well or get worse. Document Released: 07/25/2007 Document Revised: 11/26/2012 Document Reviewed: 10/15/2012 [facility] Patient Information 2014 [place]Care, [place].  Nurse will give information for your outpatient ultrasound of the gallbladder.   Medication for pain and nausea. Avoid rich greasy foods.

## 2013-03-07 NOTE — ED Provider Notes (Signed)
CSN: [id number]     Arrival date & time 03/07/13  1054 History  This chart was scribed for Donnetta HutchingBrian Keeanna Villafranca, MD by Joaquin MusicKristina Sanchez-Matthews, ED Scribe. This patient was seen in room APA02/APA02 and the patient's care was started at 11:28 AM.  Chief Complaint  Patient presents with  . Abdominal Pain    RUQ   The history is provided by the patient. No language interpreter was used.  HPI Comments: Ryan [name] is a 38 y.o. male who presents to the Emergency Department complaining of ongoing worsening RUQ abd pain and cramping that radiates to R flank that began about 1-2 hours ago. Pt states he was at work and began having abd pain that has progressively gotten worse since it initially began. Pt states he has ate at a fast food restaurant about 2 hours prior to his pain. Pt rates his current pain 7/10. He reports having R flank pain to RUQ pain that began 2 weeks ago but states he suspects this was in relation to his job. Pt states he does some lifting while at work. Pt states he took OTC Ibuprofen with no relief. Pt denies hx of gallbladder disease and states he is otherwise healthy.  History reviewed. No pertinent past medical history. History reviewed. No pertinent past surgical history. No family history on file. History  Substance Use Topics  . Smoking status: Never Smoker   . Smokeless tobacco: Not on file  . Alcohol Use: Yes     Comment: occasional    Review of Systems A complete 10 system review of systems was obtained and all systems are negative except as noted in the HPI and PMH.   Allergies  Bee venom  Home Medications   Current Outpatient Rx  Name  Route  Sig  Dispense  Refill  . albuterol (PROVENTIL HFA;VENTOLIN HFA) 108 (90 BASE) MCG/ACT inhaler   Inhalation   Inhale 2 puffs into the lungs every 6 (six) hours as needed for wheezing or shortness of breath.         Marland Kitchen. ibuprofen (ADVIL,MOTRIN) 200 MG tablet   Oral   Take 400 mg by mouth 2 (two) times daily as needed. For  pain          . tetrahydrozoline (VISINE) 0.05 % ophthalmic solution   Both Eyes   Place 1 drop into both eyes daily as needed (for contacts).           BP 156/109  Pulse 94  Temp(Src) 97.5 F (36.4 C) (Oral)  Ht 5\' 6"  (1.676 m)  Wt 170 lb (77.111 kg)  BMI 27.45 kg/m2  SpO2 99%  Physical Exam  Nursing note and vitals reviewed. Constitutional: He is oriented to person, place, and time. He appears well-developed and well-nourished.  HENT:  Head: Normocephalic and atraumatic.  Eyes: Conjunctivae and EOM are normal. Pupils are equal, round, and reactive to light.  Neck: Normal range of motion. Neck supple.  Cardiovascular: Normal rate, regular rhythm and normal heart sounds.   Pulmonary/Chest: Effort normal and breath sounds normal.  Abdominal: Soft. Bowel sounds are normal.  Musculoskeletal: Normal range of motion.  Neurological: He is alert and oriented to person, place, and time.  Skin: Skin is warm and dry.  Psychiatric: He has a normal mood and affect. His behavior is normal.    ED Course  Procedures  DIAGNOSTIC STUDIES: Oxygen Saturation is 99% on RA, normal by my interpretation.    COORDINATION OF CARE: 11:31 AM-Discussed treatment plan which includes  basic labs and administer pain medications. Pt agreed to plan.   Labs Review Labs Reviewed  CBC - Abnormal; Notable for the following:    WBC 11.9 (*)    Hemoglobin 18.3 (*)    MCHC 36.1 (*)    All other components within normal limits  BASIC METABOLIC PANEL - Abnormal; Notable for the following:    GFR calc non Af Amer 86 (*)    All other components within normal limits  HEPATIC FUNCTION PANEL - Abnormal; Notable for the following:    Total Protein 8.9 (*)    All other components within normal limits  TROPONIN I  LIPASE, BLOOD   Imaging Review Dg Abd Acute W/chest  03/07/2013   CLINICAL DATA:  Upper abdominal pain  EXAM: ACUTE ABDOMEN SERIES (ABDOMEN 2 VIEW & CHEST 1 VIEW)  COMPARISON:  08/15/2012   FINDINGS: There is no evidence of dilated bowel loops or free intraperitoneal air. No radiopaque calculi or other significant radiographic abnormality is seen. Heart size and mediastinal contours are within normal limits. Both lungs are clear.  IMPRESSION: Negative abdominal radiographs.  No acute cardiopulmonary disease.   Electronically Signed   By: Esperanza Heir M.D.   On: 03/07/2013 11:31    EKG Interpretation   None       MDM  No diagnosis found. History and physical is suggestive of gallbladder pain. Screening labs normal with the exception of a modestly elevated white count. No acute abdomen. Will get an outpatient ultrasound of the gallbladder. Discharge medications Percocet and Phenergan 25 mg  I personally performed the services described in this documentation, which was scribed in my presence. The recorded information has been reviewed and is accurate.    Donnetta Hutching, MD 03/07/13 [phone]

## 2013-03-07 NOTE — ED Notes (Signed)
Pt instructed to return to ER waiting room tomorrow to check in for US at 9 am, instructed NPO after midnight.

## 2013-03-08 ENCOUNTER — Ambulatory Visit (HOSPITAL_COMMUNITY)
Admit: 2013-03-08 | Discharge: 2013-03-08 | Disposition: A | Discharge status: Home / Self Care | Payer: 59 | Attending: Emergency Medicine | Admitting: Emergency Medicine

## 2013-03-08 ENCOUNTER — Other Ambulatory Visit (HOSPITAL_COMMUNITY): Payer: Self-pay | Admitting: Emergency Medicine

## 2013-03-08 DIAGNOSIS — R1011 Right upper quadrant pain: Secondary | ICD-10-CM | POA: Insufficient documentation

## 2013-12-25 ENCOUNTER — Emergency Department (HOSPITAL_COMMUNITY)
Admission: EM | Admit: 2013-12-25 | Discharge: 2013-12-25 | Disposition: A | Discharge status: Home / Self Care | Payer: Worker's Compensation | Attending: Emergency Medicine | Admitting: Emergency Medicine

## 2013-12-25 ENCOUNTER — Encounter (HOSPITAL_COMMUNITY): Payer: Self-pay | Admitting: Emergency Medicine

## 2013-12-25 ENCOUNTER — Emergency Department (HOSPITAL_COMMUNITY): Payer: Worker's Compensation

## 2013-12-25 DIAGNOSIS — W01198A Fall on same level from slipping, tripping and stumbling with subsequent striking against other object, initial encounter: Secondary | ICD-10-CM | POA: Insufficient documentation

## 2013-12-25 DIAGNOSIS — S39012A Strain of muscle, fascia and tendon of lower back, initial encounter: Secondary | ICD-10-CM | POA: Diagnosis not present

## 2013-12-25 DIAGNOSIS — R52 Pain, unspecified: Secondary | ICD-10-CM

## 2013-12-25 DIAGNOSIS — Z79899 Other long term (current) drug therapy: Secondary | ICD-10-CM | POA: Diagnosis not present

## 2013-12-25 DIAGNOSIS — Y9389 Activity, other specified: Secondary | ICD-10-CM | POA: Diagnosis not present

## 2013-12-25 DIAGNOSIS — Y9289 Other specified places as the place of occurrence of the external cause: Secondary | ICD-10-CM | POA: Insufficient documentation

## 2013-12-25 DIAGNOSIS — S3992XA Unspecified injury of lower back, initial encounter: Secondary | ICD-10-CM | POA: Diagnosis present

## 2013-12-25 MED ORDER — OXYCODONE-ACETAMINOPHEN 5-325 MG PO TABS
1.0000 | ORAL_TABLET | Freq: Once | ORAL | Status: AC
  Administered 2013-12-25: 1 via ORAL
  Filled 2013-12-25: qty 1

## 2013-12-25 MED ORDER — IBUPROFEN 600 MG PO TABS: 600.0000 mg | ORAL_TABLET | Freq: Four times a day (QID) | ORAL | Status: DC | PRN

## 2013-12-25 MED ORDER — METHOCARBAMOL 500 MG PO TABS
1000.0000 mg | ORAL_TABLET | Freq: Once | ORAL | Status: AC
Start: 2013-12-25 — End: 2013-12-25
  Administered 2013-12-25: 1000 mg via ORAL
  Filled 2013-12-25: qty 2

## 2013-12-25 MED ORDER — IBUPROFEN 800 MG PO TABS
800.0000 mg | ORAL_TABLET | Freq: Once | ORAL | Status: AC
  Administered 2013-12-25: 800 mg via ORAL
  Filled 2013-12-25: qty 1

## 2013-12-25 MED ORDER — OXYCODONE-ACETAMINOPHEN 5-325 MG PO TABS: 1.0000 | ORAL_TABLET | ORAL | Status: DC | PRN

## 2013-12-25 MED ORDER — METHOCARBAMOL 500 MG PO TABS: 1000.0000 mg | ORAL_TABLET | Freq: Four times a day (QID) | ORAL | Status: AC

## 2013-12-25 NOTE — Discharge Instructions (Signed)
Lumbosacral Strain Lumbosacral strain is a strain of any of the parts that make up your lumbosacral vertebrae. Your lumbosacral vertebrae are the bones that make up the lower third of your backbone. Your lumbosacral vertebrae are held together by muscles and tough, fibrous tissue (ligaments).  CAUSES  A sudden blow to your back can cause lumbosacral strain. Also, anything that causes an excessive stretch of the muscles in the low back can cause this strain. This is typically seen when people exert themselves strenuously, fall, lift heavy objects, bend, or crouch repeatedly. RISK FACTORS  Physically demanding work.  Participation in pushing or pulling sports or sports that require a sudden twist of the back (tennis, golf, baseball).  Weight lifting.  Excessive lower back curvature.  Forward-tilted pelvis.  Weak back or abdominal muscles or both.  Tight hamstrings. SIGNS AND SYMPTOMS  Lumbosacral strain may cause pain in the area of your injury or pain that moves (radiates) down your leg.  DIAGNOSIS Your health care provider can often diagnose lumbosacral strain through a physical exam. In some cases, you may need tests such as X-ray exams.  TREATMENT  Treatment for your lower back injury depends on many factors that your clinician will have to evaluate. However, most treatment will include the use of anti-inflammatory medicines. HOME CARE INSTRUCTIONS   Avoid hard physical activities (tennis, racquetball, waterskiing) if you are not in proper physical condition for it. This may aggravate or create problems.  If you have a back problem, avoid sports requiring sudden body movements. Swimming and walking are generally safer activities.  Maintain good posture.  Maintain a healthy weight.  For acute conditions, you may put ice on the injured area.  Put ice in a plastic bag.  Place a towel between your skin and the bag.  Leave the ice on for 20 minutes, 2-3 times a day.  When the  low back starts healing, stretching and strengthening exercises may be recommended. SEEK MEDICAL CARE IF:  Your back pain is getting worse.  You experience severe back pain not relieved with medicines. SEEK IMMEDIATE MEDICAL CARE IF:   You have numbness, tingling, weakness, or problems with the use of your arms or legs.  There is a change in bowel or bladder control.  You have increasing pain in any area of the body, including your belly (abdomen).  You notice shortness of breath, dizziness, or feel faint.  You feel sick to your stomach (nauseous), are throwing up (vomiting), or become sweaty.  You notice discoloration of your toes or legs, or your feet get very cold. MAKE SURE YOU:   Understand these instructions.  Will watch your condition.  Will get help right away if you are not doing well or get worse. Document Released: 11/15/2004 Document Revised: 02/10/2013 Document Reviewed: 09/24/2012 [facility] Patient Information 2015 [place]Care, [place]. This information is not intended to replace advice given to you by your health care provider. Make sure you discuss any questions you have with your health care provider.   Do not drive within 4 hours of taking oxycodone as this will make you drowsy.  Avoid lifting,  Bending,  Twisting or any other activity that worsens your pain over the next week.  Apply an  icepack  to your lower back for 10-15 minutes every 2 hours for the next 2 days.  You may add a heating pad treatment for 20 minutes 3 times daily starting on Monday.  You should get rechecked if your symptoms are not better over  the next 5 days,  Or you develop increased pain,  Weakness in your leg(s) or loss of bladder or bowel function - these are symptoms of a worse injury.

## 2013-12-25 NOTE — ED Notes (Signed)
PT c/o lower back pain since 1500 today while moving heavy equipment at work.

## 2013-12-25 NOTE — ED Notes (Signed)
Patient with no complaints at this time. Respirations even and unlabored. Skin warm/dry. Discharge instructions reviewed with patient at this time. Patient given opportunity to voice concerns/ask questions. Patient discharged at this time and left Emergency Department with steady gait.   

## 2013-12-25 NOTE — ED Notes (Signed)
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  States he fell to the ground when pain hit.  Was able to stand up, but w/increasing pain.  Pain is in lower lumbar spine w/paraspinous tenderness. Possible stepoff around L5-6.  Pain increases with minimal flexion and extension.

## 2013-12-25 NOTE — ED Provider Notes (Signed)
CSN: [id number]     Arrival date & time 12/25/13  1547 History   First MD Initiated Contact with Patient 12/25/13 1728     Chief Complaint  Patient presents with  . Back Pain     (Consider location/radiation/quality/duration/timing/severity/associated sxs/prior Treatment) Patient is a 38 y.o. male presenting with back pain. The history is provided by the patient.  Back Pain Location:  Lumbar spine Quality:  Stabbing and shooting Radiates to: bilateral lower back. Pain severity:  Severe Pain is:  Same all the time Onset quality:  Sudden Duration:  2 hours Timing:  Constant Progression:  Unchanged Chronicity:  New Context: lifting heavy objects and physical stress   Context comment:  He was pulling back on a handcart at work to lift a washing machine when he had sudden sharp pain in his lower back. Relieved by:  Being still Worsened by:  Bending and movement Ineffective treatments:  None tried Associated symptoms: no abdominal pain, no bladder incontinence, no bowel incontinence, no chest pain, no dysuria, no fever, no leg pain, no numbness, no paresthesias, no perianal numbness, no tingling and no weakness   Risk factors: no hx of cancer and no steroid use     History reviewed. No pertinent past medical history. History reviewed. No pertinent past surgical history. No family history on file. History  Substance Use Topics  . Smoking status: Never Smoker   . Smokeless tobacco: Not on file  . Alcohol Use: Yes     Comment: occasional    Review of Systems  Constitutional: Negative for fever.  Respiratory: Negative for shortness of breath.   Cardiovascular: Negative for chest pain and leg swelling.  Gastrointestinal: Negative for abdominal pain, constipation and bowel incontinence.  Genitourinary: Negative for bladder incontinence, dysuria, urgency, frequency, flank pain and difficulty urinating.  Musculoskeletal: Positive for back pain. Negative for joint swelling and gait  problem.  Skin: Negative for rash.  Neurological: Negative for tingling, weakness, numbness and paresthesias.      Allergies  Bee venom  Home Medications   Prior to Admission medications   Medication Sig Start Date End Date Taking? Authorizing Provider  DiphenhydrAMINE HCl (ALLERGY MED PO) Take 1 tablet by mouth daily.   Yes Historical Provider, MD  Tetrahydrozoline HCl (VISINE OP) Place 1 drop into both eyes 2 (two) times daily as needed (dry eyes).   Yes Historical Provider, MD  ibuprofen (ADVIL,MOTRIN) 600 MG tablet Take 1 tablet (600 mg total) by mouth every 6 (six) hours as needed. 12/25/13   Burgess AmorJulie Kiwana Deblasi, PA-C  methocarbamol (ROBAXIN) 500 MG tablet Take 2 tablets (1,000 mg total) by mouth 4 (four) times daily. 12/25/13 01/04/14  Burgess AmorJulie Nekeshia Lenhardt, PA-C  oxyCODONE-acetaminophen (PERCOCET/ROXICET) 5-325 MG per tablet Take 1 tablet by mouth every 4 (four) hours as needed. 12/25/13   Burgess AmorJulie Sina Lucchesi, PA-C  promethazine (PHENERGAN) 25 MG tablet Take 1 tablet (25 mg total) by mouth every 6 (six) hours as needed for nausea or vomiting. Patient not taking: Reported on 12/25/2013 03/07/13   Donnetta HutchingBrian Cook, MD   BP 131/101 mmHg  Pulse 81  Temp(Src) 98.6 F (37 C) (Oral)  Resp 16  Ht 5\' 6"  (1.676 m)  Wt 170 lb (77.111 kg)  BMI 27.45 kg/m2  SpO2 99% Physical Exam  Constitutional: He appears well-developed and well-nourished.  HENT:  Head: Normocephalic.  Eyes: Conjunctivae are normal.  Neck: Normal range of motion. Neck supple.  Cardiovascular: Normal rate and intact distal pulses.   Pedal pulses normal.  Pulmonary/Chest: Effort  normal.  Abdominal: Soft. Bowel sounds are normal. He exhibits no distension and no mass.  Musculoskeletal: Normal range of motion. He exhibits no edema.       Lumbar back: He exhibits tenderness and bony tenderness. He exhibits no swelling, no edema and no spasm.  Neurological: He is alert. He has normal strength. He displays no atrophy and no tremor. No sensory deficit. Gait  normal.  Reflex Scores:      Patellar reflexes are 2+ on the right side and 2+ on the left side.      Achilles reflexes are 2+ on the right side and 2+ on the left side. No strength deficit noted in hip and knee flexor and extensor muscle groups.  Ankle flexion and extension intact.  Skin: Skin is warm and dry.  Psychiatric: He has a normal mood and affect.  Nursing note and vitals reviewed.   ED Course  Procedures (including critical care time) Labs Review Labs Reviewed - No data to display  Imaging Review Dg Lumbar Spine Complete  12/25/2013   CLINICAL DATA:  Lower back pain, lifting injury  EXAM: LUMBAR SPINE - COMPLETE 4+ VIEW  COMPARISON:  03/07/2013  FINDINGS: Five lumbar type vertebral bodies.  Normal lumbar lordosis.  No evidence of fracture or dislocation. Vertebral body heights and intervertebral disc spaces are maintained.  Visualized bony pelvis appears intact.  IMPRESSION: Normal lumbar spine radiographs.   Electronically Signed   By: Charline BillsSriyesh  Krishnan M.D.   On: 12/25/2013 18:33     EKG Interpretation None      MDM   Final diagnoses:  Pain  Lumbar strain, initial encounter   Patients labs and/or radiological studies were viewed and considered during the medical decision making and disposition process. No neuro deficit on exam or by history to suggest emergent or surgical presentation.  Also discussed worsened sx that should prompt immediate re-evaluation including distal weakness, bowel/bladder retention/incontinence.  Patient was prescribed oxycodone, Robaxin and ibuprofen.  Encouraged activity as tolerated, avoiding any activity that worsens his pain.  He was given a work note for light duty, advise follow-up with his PCP for recheck of symptoms if they persist and the next 7 days.      Burgess AmorJulie Quierra Silverio, PA-C 12/26/13 [id number]  Glynn OctaveStephen Rancour, MD 12/26/13 [phone]

## 2016-02-20 DIAGNOSIS — I214 Non-ST elevation (NSTEMI) myocardial infarction: Secondary | ICD-10-CM

## 2016-02-20 HISTORY — DX: Non-ST elevation (NSTEMI) myocardial infarction: I21.4

## 2016-05-17 ENCOUNTER — Encounter (HOSPITAL_COMMUNITY): Payer: Self-pay

## 2016-05-17 ENCOUNTER — Emergency Department (HOSPITAL_COMMUNITY): Payer: 59

## 2016-05-17 ENCOUNTER — Inpatient Hospital Stay (HOSPITAL_COMMUNITY)
Admission: EM | Admit: 2016-05-17 | Discharge: 2016-05-18 | DRG: 282 | Disposition: A | Discharge status: Home / Self Care | Payer: 59 | Attending: Cardiology | Admitting: Cardiology

## 2016-05-17 ENCOUNTER — Other Ambulatory Visit: Payer: Self-pay

## 2016-05-17 DIAGNOSIS — I25110 Atherosclerotic heart disease of native coronary artery with unstable angina pectoris: Secondary | ICD-10-CM | POA: Diagnosis present

## 2016-05-17 DIAGNOSIS — I10 Essential (primary) hypertension: Secondary | ICD-10-CM | POA: Diagnosis present

## 2016-05-17 DIAGNOSIS — Z87891 Personal history of nicotine dependence: Secondary | ICD-10-CM

## 2016-05-17 DIAGNOSIS — R7989 Other specified abnormal findings of blood chemistry: Secondary | ICD-10-CM

## 2016-05-17 DIAGNOSIS — R079 Chest pain, unspecified: Secondary | ICD-10-CM | POA: Diagnosis not present

## 2016-05-17 DIAGNOSIS — R279 Unspecified lack of coordination: Secondary | ICD-10-CM | POA: Diagnosis not present

## 2016-05-17 DIAGNOSIS — R0789 Other chest pain: Secondary | ICD-10-CM | POA: Diagnosis not present

## 2016-05-17 DIAGNOSIS — E785 Hyperlipidemia, unspecified: Secondary | ICD-10-CM | POA: Diagnosis present

## 2016-05-17 DIAGNOSIS — Z8249 Family history of ischemic heart disease and other diseases of the circulatory system: Secondary | ICD-10-CM | POA: Diagnosis not present

## 2016-05-17 DIAGNOSIS — Z743 Need for continuous supervision: Secondary | ICD-10-CM | POA: Diagnosis not present

## 2016-05-17 DIAGNOSIS — F17201 Nicotine dependence, unspecified, in remission: Secondary | ICD-10-CM | POA: Diagnosis not present

## 2016-05-17 DIAGNOSIS — I214 Non-ST elevation (NSTEMI) myocardial infarction: Principal | ICD-10-CM | POA: Diagnosis present

## 2016-05-17 DIAGNOSIS — Z91030 Bee allergy status: Secondary | ICD-10-CM

## 2016-05-17 DIAGNOSIS — R778 Other specified abnormalities of plasma proteins: Secondary | ICD-10-CM

## 2016-05-17 DIAGNOSIS — I2510 Atherosclerotic heart disease of native coronary artery without angina pectoris: Secondary | ICD-10-CM | POA: Diagnosis not present

## 2016-05-17 HISTORY — DX: Bee allergy status: Z91.030

## 2016-05-17 LAB — CBC
HCT: 42.4 % (ref 39.0–52.0)
Hemoglobin: 14.2 g/dL (ref 13.0–17.0)
MCH: 29.5 pg (ref 26.0–34.0)
MCHC: 33.5 g/dL (ref 30.0–36.0)
MCV: 88 fL (ref 78.0–100.0)
PLATELETS: 256 10*3/uL (ref 150–400)
RBC: 4.82 MIL/uL (ref 4.22–5.81)
RDW: 12.6 % (ref 11.5–15.5)
WBC: 11.6 10*3/uL — ABNORMAL HIGH (ref 4.0–10.5)

## 2016-05-17 LAB — TROPONIN I: Troponin I: 0.31 ng/mL (ref <0.03)

## 2016-05-17 LAB — BASIC METABOLIC PANEL
Anion gap: 10 (ref 5–15)
BUN: 21 mg/dL — ABNORMAL HIGH (ref 6–20)
CO2: 26 mmol/L (ref 22–32)
Calcium: 9 mg/dL (ref 8.9–10.3)
Chloride: 103 mmol/L (ref 101–111)
Creatinine, Ser: 1.21 mg/dL (ref 0.61–1.24)
GFR calc Af Amer: >60 mL/min (ref >60)
GFR calc non Af Amer: >60 mL/min (ref >60)
GLUCOSE: 93 mg/dL (ref 65–99)
Potassium: 3.3 mmol/L — ABNORMAL LOW (ref 3.5–5.1)
Sodium: 139 mmol/L (ref 135–145)

## 2016-05-17 LAB — HEPARIN LEVEL (UNFRACTIONATED): HEPARIN UNFRACTIONATED: 0.14 [IU]/mL — AB (ref 0.30–0.70)

## 2016-05-17 MED ORDER — ACETAMINOPHEN 325 MG PO TABS: 650.0000 mg | ORAL_TABLET | ORAL | Status: DC | PRN

## 2016-05-17 MED ORDER — ALPRAZOLAM 0.25 MG PO TABS: 0.2500 mg | ORAL_TABLET | Freq: Two times a day (BID) | ORAL | Status: DC | PRN

## 2016-05-17 MED ORDER — ASPIRIN 300 MG RE SUPP: 300.0000 mg | RECTAL | Status: AC

## 2016-05-17 MED ORDER — METOPROLOL TARTRATE 25 MG PO TABS
25.0000 mg | ORAL_TABLET | Freq: Two times a day (BID) | ORAL | Status: DC
  Administered 2016-05-17 – 2016-05-18 (×2): 25 mg via ORAL
  Filled 2016-05-17 (×2): qty 1

## 2016-05-17 MED ORDER — ASPIRIN EC 81 MG PO TBEC
81.0000 mg | DELAYED_RELEASE_TABLET | Freq: Every day | ORAL | Status: DC
  Administered 2016-05-18: 81 mg via ORAL
  Filled 2016-05-17: qty 1

## 2016-05-17 MED ORDER — ASPIRIN 81 MG PO CHEW
324.0000 mg | CHEWABLE_TABLET | Freq: Once | ORAL | Status: AC
  Administered 2016-05-17: 324 mg via ORAL
  Filled 2016-05-17: qty 4

## 2016-05-17 MED ORDER — HEPARIN BOLUS VIA INFUSION
2450.0000 [IU] | Freq: Once | INTRAVENOUS | Status: AC
  Administered 2016-05-17: 2450 [IU] via INTRAVENOUS
  Filled 2016-05-17: qty 2450

## 2016-05-17 MED ORDER — SODIUM CHLORIDE 0.9 % IV SOLN: 250.0000 mL | INTRAVENOUS | Status: DC | PRN

## 2016-05-17 MED ORDER — ONDANSETRON HCL 4 MG/2ML IJ SOLN: 4.0000 mg | Freq: Four times a day (QID) | INTRAMUSCULAR | Status: DC | PRN

## 2016-05-17 MED ORDER — SODIUM CHLORIDE 0.9% FLUSH: 3.0000 mL | Freq: Two times a day (BID) | INTRAVENOUS | Status: DC

## 2016-05-17 MED ORDER — ZOLPIDEM TARTRATE 5 MG PO TABS
5.0000 mg | ORAL_TABLET | Freq: Every evening | ORAL | Status: DC | PRN
  Administered 2016-05-17: 5 mg via ORAL
  Filled 2016-05-17: qty 1

## 2016-05-17 MED ORDER — HEPARIN SODIUM (PORCINE) 5000 UNIT/ML IJ SOLN: 4000.0000 [IU] | Freq: Once | INTRAMUSCULAR | Status: DC

## 2016-05-17 MED ORDER — ASPIRIN 81 MG PO CHEW: 324.0000 mg | CHEWABLE_TABLET | ORAL | Status: AC

## 2016-05-17 MED ORDER — NITROGLYCERIN 0.4 MG SL SUBL: 0.4000 mg | SUBLINGUAL_TABLET | SUBLINGUAL | Status: DC | PRN

## 2016-05-17 MED ORDER — HEPARIN BOLUS VIA INFUSION
4000.0000 [IU] | Freq: Once | INTRAVENOUS | Status: AC
  Administered 2016-05-17: 4000 [IU] via INTRAVENOUS

## 2016-05-17 MED ORDER — HEPARIN (PORCINE) IN NACL 100-0.45 UNIT/ML-% IJ SOLN
1300.0000 [IU]/h | INTRAMUSCULAR | Status: DC
  Administered 2016-05-17 (×2): 1000 [IU]/h via INTRAVENOUS
  Filled 2016-05-17 (×2): qty 250

## 2016-05-17 MED ORDER — SODIUM CHLORIDE 0.9% FLUSH: 3.0000 mL | INTRAVENOUS | Status: DC | PRN

## 2016-05-17 MED ORDER — ATORVASTATIN CALCIUM 80 MG PO TABS
80.0000 mg | ORAL_TABLET | Freq: Every day | ORAL | Status: DC
  Administered 2016-05-17: 80 mg via ORAL
  Filled 2016-05-17: qty 1

## 2016-05-17 NOTE — ED Notes (Signed)
[facility], nurse will call back.  Transport has arrived.

## 2016-05-17 NOTE — Progress Notes (Signed)
Pt oriented to room and equipment. VSS. Heparin drip running at 10 mL/hr. Telemetry applied, CCMD notified. Call bell within reach, will continue to monitor.  Leonidas Rombergaitlin S Bumbledare, RN

## 2016-05-17 NOTE — ED Triage Notes (Signed)
Pt reports that his chest started hurting yesterday while doing yard work. Pain lasted approx 45 mins. Pain was in left chest with radiation to left arm.And subsided. States pain is still present today and was sent her by his job

## 2016-05-17 NOTE — Progress Notes (Signed)
ANTICOAGULATION CONSULT NOTE - Initial Consult  Pharmacy Consult for Heparin Indication: chest pain/ACS  Allergies  Allergen Reactions  . Bee Venom Anaphylaxis    Patient Measurements: Height: 5\' 6"  (167.6 cm) Weight: 183 lb (83 kg) IBW/kg (Calculated) : 63.8 HEPARIN DW (KG): 80.7  Vital Signs: Temp: 99 F (37.2 C) (03/29 1214) Temp Source: Oral (03/29 1214) BP: 146/98 (03/29 1330) Pulse Rate: 77 (03/29 1330)  Labs:  Recent Labs  05/17/16 1238  HGB 14.2  HCT 42.4  PLT 256  CREATININE 1.21  TROPONINI 0.31*    Estimated Creatinine Clearance: 82.9 mL/min (by C-G formula based on SCr of 1.21 mg/dL).   Medications:  See med rec  Assessment: 41 yo male presents to ED with chest pain. Patient's pain started yesterday and worsened with exertion and was associated with nausea and dyspnea.  Family history positive for an early MI in his father at age 39. Patient has been a smoker until the last 3 months. Symptoms reoccurred today at work.Elevated troponin is 0.31. Pharmacy asked to start anticoagulation with heparin  Goal of Therapy:  Heparin level 0.3-0.7 units/ml Monitor platelets by anticoagulation protocol: Yes   Plan:  Give 4000 units bolus x 1 Start heparin infusion at 1000 units/hr Check anti-Xa level in 6 hours and daily while on heparin Continue to monitor H&H and platelets   Elder CyphersLorie Riyaan Heroux, BS Loura BackPharm D, BCPS Clinical Pharmacist Pager [phone] 05/17/2016,2:22 PM

## 2016-05-17 NOTE — ED Notes (Signed)
CRITICAL VALUE ALERT  Critical value received:  Troponin 0.31  Date of notification:  3 29 18   Time of notification:  1335  Critical value read back:Yes.    Nurse who received alert:  Berdine DanceMandi Sheza Strickland RN  MD notified (1st page):  Adriana Simasook  Time of first page:  1335  MD notified (2nd page):  Time of second page:  Responding MD:  Adriana Simasook  Time MD responded:  [phone]

## 2016-05-17 NOTE — ED Notes (Signed)
[facility] contacted to [facility]

## 2016-05-17 NOTE — Progress Notes (Signed)
ANTICOAGULATION CONSULT NOTE - Follow Up Consult  Pharmacy Consult for heparin Indication: chest pain/ACS  Allergies  Allergen Reactions  . Bee Venom Anaphylaxis    Patient Measurements: Height: 5\' 6"  (167.6 cm) Weight: 189 lb 8 oz (86 kg) IBW/kg (Calculated) : 63.8 Heparin Dosing Weight: 82 kg  Vital Signs: Temp: 98.2 F (36.8 C) (03/29 2113) Temp Source: Oral (03/29 2113) BP: 136/85 (03/29 2113) Pulse Rate: 82 (03/29 2113)  Labs:  Recent Labs  05/17/16 1238 05/17/16 2045  HGB 14.2  --   HCT 42.4  --   PLT 256  --   HEPARINUNFRC  --  0.14*  CREATININE 1.21  --   TROPONINI 0.31*  --     Estimated Creatinine Clearance: 84.3 mL/min (by C-G formula based on SCr of 1.21 mg/dL).   Medications:  Infusions:  . heparin 1,000 Units/hr (05/17/16 1752)    Assessment: 41 y/o male with family history of premature CAD who presented to [facility] ED with chest tightness, weakness, SOB, and radiation to left arm. He was started on IV heparin and [facility]. Initial troponin 0.31. Plan is for cath tomorrow.  Heparin level is subtherapeutic at 0.14 on 1000 units/hr. No bleeding noted.   Goal of Therapy:  Heparin level 0.3-0.7 units/ml Monitor platelets by anticoagulation protocol: Yes   Plan:  - Heparin 2450 unit IV bolus then increase rate to 1300 units/hr - 6 hr heparin level - Daily heparin level and CBC   Loura BackJennifer Corcoran, PharmD, BCPS Clinical Pharmacist Phone for today [phone]- [phone] Main pharmacy - [phone] 05/17/2016 9:42 PM

## 2016-05-17 NOTE — H&P (Signed)
Evaluating cardiologist: Dr. Jonelle SidleSamuel G. Cassanda Walmer Primary care provider: [facility]  Reason for admission: Unstable angina  Clinical Summary Mr. Ryan [name] is a 41 y.o.male with a history of tobacco abuse (quit smoking in January of this year) and family history of premature CAD in his father (sees Dr. Eden EmmsNishan). He presented to the ER today with his wife stating that yesterday while working outdoors in his yard and chopping wood he began to develop a tightness in his chest associated with weakness and shortness of breath, also radiation to the left arm. He rested and ultimately these symptoms went away, but today when he was at work he began to experience lightheadedness and shortness of breath, subsequently noted that his blood pressure was elevated at home when he checked it, and came in for further assessment. He does not report any recent exertional symptoms until yesterday.  I personally reviewed his ECG today which shows sinus rhythm with nonspecific ST-T changes. Initial troponin I is 0.31. Chest x-ray does not show any acute findings.  He is not on any prescription medications at home. He does not report any prior ischemic cardiac testing other than ECGs. He has not had regular follow-up with a PCP for several years, [facility] and was trying to reestablish just recently.   Allergies  Allergen Reactions  . Bee Venom Anaphylaxis    Home Medications No regular prescriptions  Past Medical History:  Diagnosis Date  . Bee sting allergy   . History of tobacco abuse     History reviewed. No pertinent surgical history.  Family History  Problem Relation Age of Onset  . CAD Father     Premature CAD status post CABG and PCI  . CAD Paternal Grandmother     Premature CAD    Social History Mr. Ryan [name] reports that he quit smoking about 2 months ago. His smoking use included Cigarettes. He has never used smokeless tobacco. Mr.  Ryan [name] reports that he does not drink alcohol.  Review of Systems Complete review of systems negative except as otherwise outlined in the clinical summary.  Physical Examination Temp:  [99 F (37.2 C)] 99 F (37.2 C) (03/29 1214) Pulse Rate:  [70-78] 77 (03/29 1330) Resp:  [14-21] 21 (03/29 1330) BP: (140-149)/(74-98) 146/98 (03/29 1330) SpO2:  [97 %-100 %] 97 % (03/29 1330) Weight:  [183 lb (83 kg)] 183 lb (83 kg) (03/29 1211) No intake or output data in the 24 hours ending 05/17/16 1456  Telemetry: Sinus rhythm. Personally reviewed.  Gen.: Normally nourished appearing male in no acute distress. HEENT: Conjunctiva and lids normal, oropharynx clear. Neck: Supple, no elevated JVP or carotid bruits, no thyromegaly. Lungs: Clear to auscultation, nonlabored breathing at rest. Cardiac: Regular rate and rhythm, no S3 or significant systolic murmur, no pericardial rub. Abdomen: Soft, nontender, bowel sounds present, no guarding or rebound. Extremities: No pitting edema, distal pulses 2+. Skin: Warm and dry. Multiple tattoos thorax and arms. Musculoskeletal: No kyphosis. Neuropsychiatric: Alert and oriented x3, affect grossly appropriate.   Lab Results  Basic Metabolic Panel:  Recent Labs Lab 05/17/16 1238  NA 139  K 3.3*  CL 103  CO2 26  GLUCOSE 93  BUN 21*  CREATININE 1.21  CALCIUM 9.0    CBC:  Recent Labs Lab 05/17/16 1238  WBC 11.6*  HGB 14.2  HCT 42.4  MCV 88.0  PLT 256    Cardiac Enzymes:  Recent Labs Lab 05/17/16 1238  TROPONINI 0.31*    Imaging Chest x-ray 05/17/2016: FINDINGS: The heart size and mediastinal contours are within normal limits. Both lungs are clear. The visualized skeletal structures are unremarkable.  IMPRESSION: No active cardiopulmonary disease.  Impression  1. Symptoms concerning for unstable angina, onset yesterday in the setting of physical activity. ECG is abnormal but nonspecific, initial troponin I 0.31 suggesting  developing ACS/NSTEMI. He feels better at this time, has been given aspirin with plan to start heparin per ER staff.  2. Family history of premature CAD.  3. Tobacco abuse in remission.  4. Uncertain lipid status. No reported history of hyperlipidemia.  Recommendations  Discussed with patient and wife as well as Dr. Adriana Simas. Plan for patient to be transferred to our cardiology service at [facility], Dr. Herbie Baltimore is the accepting physician. Continue aspirin and heparin, start emperic statin and also beta blocker, check FLP, LFTs, and Hgb A1c. Continue to cycle cardiac markers, initiate IV nitroglycerin if recurring chest pain. Patient tentatively scheduled for diagnostic cardiac catheterization with Dr. Katrinka Blazing for tomorrow, sooner if clinical status worsens.  Jonelle Sidle, M.D., F.A.C.C.

## 2016-05-17 NOTE — ED Provider Notes (Signed)
[facility]-EMERGENCY DEPT Provider Note   CSN: [id number] Arrival date & time: 05/17/16  1158     History   Chief Complaint Chief Complaint  Patient presents with  . Chest Pain    HPI Ryan [name] is a 41 y.o. male.  Level V caveat for urgent need for intervention. Chest pain starting yesterday while doing yard work which felt like a "[place] kicked [place] in the chest" c associated dyspnea and nausea. Family history positive for an early MI in his father at age 39. Patient has been a smoker until the last 3 months. Other risk factors include hyperlipidemia and hypertension.  Symptoms have also occurred today at work.      History reviewed. No pertinent past medical history.  Patient Active Problem List   Diagnosis Date Noted  . MIXED HYPERLIPIDEMIA 03/24/2008  . HYPERLIPIDEMIA-MIXED 03/24/2008  . GENERALIZED ANXIETY DISORDER 03/24/2008  . ESSENTIAL HYPERTENSION, BENIGN 03/24/2008  . MITRAL VALVE PROLAPSE 03/24/2008    History reviewed. No pertinent surgical history.     Home Medications    Prior to Admission medications   Medication Sig Start Date End Date Taking? Authorizing Provider  acetaminophen (TYLENOL) 500 MG tablet Take 1,000 mg by mouth every 6 (six) hours as needed.   Yes Historical Provider, MD  ibuprofen (ADVIL,MOTRIN) 600 MG tablet Take 1 tablet (600 mg total) by mouth every 6 (six) hours as needed. 12/25/13  Yes Raynelle FanningJulie Idol, PA-C  OVER THE COUNTER MEDICATION Take 2 tablets by mouth 2 (two) times daily. Ultra test testosterone   Yes Historical Provider, MD    Family History No family history on file.  Social History Social History  Substance Use Topics  . Smoking status: Former Games developermoker  . Smokeless tobacco: Never Used  . Alcohol use No     Allergies   Bee venom   Review of Systems Review of Systems  All other systems reviewed and are negative.    Physical Exam Updated Vital Signs BP (!) 143/89 (BP Location: Left Arm)   Pulse 77   Temp 99 F  (37.2 C) (Oral)   Resp 17   Ht 5\' 6"  (1.676 m)   Wt 183 lb (83 kg)   SpO2 97%   BMI 29.54 kg/m   Physical Exam  Constitutional: He is oriented to person, place, and time. He appears well-developed and well-nourished.  HENT:  Head: Normocephalic and atraumatic.  Eyes: Conjunctivae are normal.  Neck: Neck supple.  Cardiovascular: Normal rate and regular rhythm.   Pulmonary/Chest: Effort normal and breath sounds normal.  Abdominal: Soft. Bowel sounds are normal.  Musculoskeletal: Normal range of motion.  Neurological: He is alert and oriented to person, place, and time.  Skin: Skin is warm and dry.  Psychiatric: He has a normal mood and affect. His behavior is normal.  Nursing note and vitals reviewed.    ED Treatments / Results  Labs (all labs ordered are listed, but only abnormal results are displayed) Labs Reviewed  BASIC METABOLIC PANEL - Abnormal; Notable for the following:       Result Value   Potassium 3.3 (*)    BUN 21 (*)    All other components within normal limits  CBC - Abnormal; Notable for the following:    WBC 11.6 (*)    All other components within normal limits  TROPONIN I - Abnormal; Notable for the following:    Troponin I 0.31 (*)    All other components within normal limits    EKG  EKG Interpretation  Date/Time:  Thursday May 17 2016 12:11:40 EDT Ventricular Rate:  81 PR Interval:  136 QRS Duration: 88 QT Interval:  356 QTC Calculation: 413 R Axis:   47 Text Interpretation:  Sinus rhythm with marked sinus arrhythmia Non-specific ST-t changes Abnormal ECG Confirmed by Juleen China  MD, Jeannett Senior ([id number]) on 05/17/2016 12:31:49 PM       Radiology No results found.  Procedures Procedures (including critical care time)  Medications Ordered in ED Medications  aspirin chewable tablet 324 mg (not administered)     Initial Impression / Assessment and Plan / ED Course  I have reviewed the triage vital signs and the nursing notes.  Pertinent  labs & imaging results that were available during my care of the patient were reviewed by me and considered in my medical decision making (see chart for details).     Patient presents with chest pain and multiple cardiac risk factors. EKG shows no acute changes, but troponin is 0.31. Will Rx aspirin and consult cardiology.  Final Clinical Impressions(s) / ED Diagnoses   Final diagnoses:  Chest pain, unspecified type  Elevated troponin    New Prescriptions New Prescriptions   No medications on file     Donnetta Hutching, MD 05/17/16 1348

## 2016-05-17 NOTE — ED Notes (Signed)
Report to Caitlyn RN

## 2016-05-18 ENCOUNTER — Telehealth: Payer: Self-pay

## 2016-05-18 ENCOUNTER — Inpatient Hospital Stay (HOSPITAL_COMMUNITY): Payer: 59

## 2016-05-18 ENCOUNTER — Encounter (HOSPITAL_COMMUNITY)
Admission: EM | Disposition: A | Discharge status: Home / Self Care | Payer: Self-pay | Source: Home / Self Care | Attending: Cardiology

## 2016-05-18 ENCOUNTER — Encounter (HOSPITAL_COMMUNITY): Payer: Self-pay | Admitting: Cardiovascular Disease

## 2016-05-18 DIAGNOSIS — I2510 Atherosclerotic heart disease of native coronary artery without angina pectoris: Secondary | ICD-10-CM

## 2016-05-18 DIAGNOSIS — E785 Hyperlipidemia, unspecified: Secondary | ICD-10-CM

## 2016-05-18 HISTORY — PX: LEFT HEART CATH AND CORONARY ANGIOGRAPHY: CATH118249

## 2016-05-18 LAB — HEPATIC FUNCTION PANEL
ALBUMIN: 4 g/dL (ref 3.5–5.0)
ALT: 29 U/L (ref 17–63)
AST: 24 U/L (ref 15–41)
Alkaline Phosphatase: 38 U/L (ref 38–126)
Bilirubin, Direct: <0.1 mg/dL — ABNORMAL LOW (ref 0.1–0.5)
TOTAL PROTEIN: 6.4 g/dL — AB (ref 6.5–8.1)
Total Bilirubin: 0.5 mg/dL (ref 0.3–1.2)

## 2016-05-18 LAB — CBC
HCT: 41.4 % (ref 39.0–52.0)
Hemoglobin: 13.7 g/dL (ref 13.0–17.0)
MCH: 29.1 pg (ref 26.0–34.0)
MCHC: 33.1 g/dL (ref 30.0–36.0)
MCV: 87.9 fL (ref 78.0–100.0)
PLATELETS: 246 10*3/uL (ref 150–400)
RBC: 4.71 MIL/uL (ref 4.22–5.81)
RDW: 12.8 % (ref 11.5–15.5)
WBC: 11 10*3/uL — ABNORMAL HIGH (ref 4.0–10.5)

## 2016-05-18 LAB — BASIC METABOLIC PANEL
Anion gap: 9 (ref 5–15)
BUN: 17 mg/dL (ref 6–20)
CALCIUM: 9.1 mg/dL (ref 8.9–10.3)
CO2: 27 mmol/L (ref 22–32)
CREATININE: 1.1 mg/dL (ref 0.61–1.24)
Chloride: 105 mmol/L (ref 101–111)
GFR calc non Af Amer: >60 mL/min (ref >60)
GLUCOSE: 104 mg/dL — AB (ref 65–99)
Potassium: 4 mmol/L (ref 3.5–5.1)
Sodium: 141 mmol/L (ref 135–145)

## 2016-05-18 LAB — PROTIME-INR
INR: 1.12
Prothrombin Time: 14.5 seconds (ref 11.4–15.2)

## 2016-05-18 LAB — LIPID PANEL
Cholesterol: 254 mg/dL — ABNORMAL HIGH (ref 0–200)
HDL: 34 mg/dL — ABNORMAL LOW (ref >40)
LDL Cholesterol: 150 mg/dL — ABNORMAL HIGH (ref 0–99)
Total CHOL/HDL Ratio: 7.5 RATIO
Triglycerides: 348 mg/dL — ABNORMAL HIGH (ref <150)
VLDL: 70 mg/dL — ABNORMAL HIGH (ref 0–40)

## 2016-05-18 LAB — HEPARIN LEVEL (UNFRACTIONATED): HEPARIN UNFRACTIONATED: 0.51 [IU]/mL (ref 0.30–0.70)

## 2016-05-18 SURGERY — LEFT HEART CATH AND CORONARY ANGIOGRAPHY
Anesthesia: LOCAL

## 2016-05-18 MED ORDER — ATORVASTATIN CALCIUM 80 MG PO TABS: 80.0000 mg | ORAL_TABLET | Freq: Every day | ORAL | 11 refills | Status: DC

## 2016-05-18 MED ORDER — SODIUM CHLORIDE 0.9 % IV SOLN: 250.0000 mL | INTRAVENOUS | Status: DC | PRN

## 2016-05-18 MED ORDER — MIDAZOLAM HCL 2 MG/2ML IJ SOLN
INTRAMUSCULAR | Status: AC
  Filled 2016-05-18: qty 2

## 2016-05-18 MED ORDER — FENTANYL CITRATE (PF) 100 MCG/2ML IJ SOLN
INTRAMUSCULAR | Status: AC
  Filled 2016-05-18: qty 2

## 2016-05-18 MED ORDER — SODIUM CHLORIDE 0.9 % WEIGHT BASED INFUSION
1.0000 mL/kg/h | INTRAVENOUS | Status: DC
  Administered 2016-05-18: 1 mL/kg/h via INTRAVENOUS

## 2016-05-18 MED ORDER — HEPARIN SODIUM (PORCINE) 1000 UNIT/ML IJ SOLN
INTRAMUSCULAR | Status: DC | PRN
  Administered 2016-05-18: 5000 [IU] via INTRAVENOUS

## 2016-05-18 MED ORDER — SODIUM CHLORIDE 0.9% FLUSH: 3.0000 mL | Freq: Two times a day (BID) | INTRAVENOUS | Status: DC

## 2016-05-18 MED ORDER — VERAPAMIL HCL 2.5 MG/ML IV SOLN
INTRAVENOUS | Status: AC
  Filled 2016-05-18: qty 2

## 2016-05-18 MED ORDER — ASPIRIN 81 MG PO TBEC: 81.0000 mg | DELAYED_RELEASE_TABLET | Freq: Every day | ORAL | Status: AC

## 2016-05-18 MED ORDER — CLOPIDOGREL BISULFATE 75 MG PO TABS
75.0000 mg | ORAL_TABLET | Freq: Every day | ORAL | Status: DC
Start: 2016-05-19 — End: 2016-05-18

## 2016-05-18 MED ORDER — LIDOCAINE HCL (PF) 1 % IJ SOLN
INTRAMUSCULAR | Status: DC | PRN
  Administered 2016-05-18: 2 mL via INTRADERMAL

## 2016-05-18 MED ORDER — HEPARIN (PORCINE) IN NACL 2-0.9 UNIT/ML-% IJ SOLN
INTRAMUSCULAR | Status: AC
  Filled 2016-05-18: qty 1000

## 2016-05-18 MED ORDER — SODIUM CHLORIDE 0.9 % WEIGHT BASED INFUSION
3.0000 mL/kg/h | INTRAVENOUS | Status: AC
  Administered 2016-05-18: 3 mL/kg/h via INTRAVENOUS

## 2016-05-18 MED ORDER — SODIUM CHLORIDE 0.9 % WEIGHT BASED INFUSION: 1.0000 mL/kg/h | INTRAVENOUS | Status: DC

## 2016-05-18 MED ORDER — MIDAZOLAM HCL 2 MG/2ML IJ SOLN
INTRAMUSCULAR | Status: DC | PRN
  Administered 2016-05-18 (×2): 2 mg via INTRAVENOUS

## 2016-05-18 MED ORDER — SODIUM CHLORIDE 0.9 % WEIGHT BASED INFUSION: 3.0000 mL/kg/h | INTRAVENOUS | Status: DC

## 2016-05-18 MED ORDER — CLOPIDOGREL BISULFATE 75 MG PO TABS: 75.0000 mg | ORAL_TABLET | Freq: Every day | ORAL | 11 refills | Status: DC

## 2016-05-18 MED ORDER — SODIUM CHLORIDE 0.9% FLUSH: 3.0000 mL | INTRAVENOUS | Status: DC | PRN

## 2016-05-18 MED ORDER — ASPIRIN 81 MG PO CHEW: 81.0000 mg | CHEWABLE_TABLET | ORAL | Status: DC

## 2016-05-18 MED ORDER — METOPROLOL TARTRATE 25 MG PO TABS: 25.0000 mg | ORAL_TABLET | Freq: Two times a day (BID) | ORAL | 11 refills | Status: DC

## 2016-05-18 MED ORDER — HEPARIN (PORCINE) IN NACL 2-0.9 UNIT/ML-% IJ SOLN
INTRAMUSCULAR | Status: DC | PRN
  Administered 2016-05-18: 1000 mL via INTRA_ARTERIAL

## 2016-05-18 MED ORDER — HEPARIN (PORCINE) IN NACL 2-0.9 UNIT/ML-% IJ SOLN
INTRAMUSCULAR | Status: DC | PRN
  Administered 2016-05-18: 10 mL via INTRA_ARTERIAL

## 2016-05-18 MED ORDER — HEPARIN SODIUM (PORCINE) 1000 UNIT/ML IJ SOLN
INTRAMUSCULAR | Status: AC
  Filled 2016-05-18: qty 1

## 2016-05-18 MED ORDER — LIDOCAINE HCL (PF) 1 % IJ SOLN
INTRAMUSCULAR | Status: AC
  Filled 2016-05-18: qty 30

## 2016-05-18 MED ORDER — ASPIRIN 81 MG PO CHEW
81.0000 mg | CHEWABLE_TABLET | ORAL | Status: AC
  Administered 2016-05-18: 81 mg via ORAL
  Filled 2016-05-18: qty 1

## 2016-05-18 MED ORDER — SODIUM CHLORIDE 0.9% FLUSH
3.0000 mL | Freq: Two times a day (BID) | INTRAVENOUS | Status: DC
Start: 2016-05-18 — End: 2016-05-18

## 2016-05-18 MED ORDER — IOPAMIDOL (ISOVUE-370) INJECTION 76%
INTRAVENOUS | Status: AC
  Filled 2016-05-18: qty 100

## 2016-05-18 MED ORDER — FENTANYL CITRATE (PF) 100 MCG/2ML IJ SOLN
INTRAMUSCULAR | Status: DC | PRN
  Administered 2016-05-18 (×2): 25 ug via INTRAVENOUS

## 2016-05-18 MED ORDER — NITROGLYCERIN 0.4 MG SL SUBL: 0.4000 mg | SUBLINGUAL_TABLET | SUBLINGUAL | 3 refills | Status: DC | PRN

## 2016-05-18 MED ORDER — CLOPIDOGREL BISULFATE 75 MG PO TABS
300.0000 mg | ORAL_TABLET | Freq: Once | ORAL | Status: AC
  Administered 2016-05-18: 300 mg via ORAL
  Filled 2016-05-18: qty 4

## 2016-05-18 MED ORDER — IOPAMIDOL (ISOVUE-370) INJECTION 76%
INTRAVENOUS | Status: DC | PRN
  Administered 2016-05-18: 65 mL via INTRA_ARTERIAL

## 2016-05-18 SURGICAL SUPPLY — 11 items
CATH 5FR JL3.5 JR4 ANG PIG MP (CATHETERS) ×1 IMPLANT
CATH INFINITI 5 FR AR1 MOD (CATHETERS) ×1 IMPLANT
DEVICE RAD COMP TR BAND LRG (VASCULAR PRODUCTS) ×1 IMPLANT
GLIDESHEATH SLEND SS 6F .021 (SHEATH) ×1 IMPLANT
GUIDEWIRE INQWIRE 1.5J.035X260 (WIRE) ×1 IMPLANT
INQWIRE 1.5J .035X260CM (WIRE) ×2
KIT HEART LEFT (KITS) ×2 IMPLANT
PACK CARDIAC CATHETERIZATION (CUSTOM PROCEDURE TRAY) ×2 IMPLANT
SYR MEDRAD MARK V 150ML (SYRINGE) ×2 IMPLANT
TRANSDUCER W/STOPCOCK (MISCELLANEOUS) ×2 IMPLANT
TUBING CIL FLEX 10 FLL-RA (TUBING) ×2 IMPLANT

## 2016-05-18 NOTE — Progress Notes (Signed)
CARDIAC REHAB PHASE I   PRE:  Rate/Rhythm: 79 SR  BP:  Supine:   Sitting: 127/77  Standing:    SaO2: 97%RA  MODE:  Ambulation: 550 ft   POST:  Rate/Rhythm: 92 SR  BP:  Supine:   Sitting: 143/82  Standing:    SaO2: 97%RA 1435-1530 Pt walked 550 ft with steady gait and no CP. Tolerated well. MI education completed with pt and wife who voiced understanding. Stressed importance of plavix and statin. Pt has quit smoking and has no plans to restart. Discussed heart healthy diet, NTG use, risk factors and ex ed. Referring to [place] Phase 2.   Luetta Nutting, RN BSN  05/18/2016 3:21 PM

## 2016-05-18 NOTE — Interval H&P Note (Signed)
Cath Lab Visit (complete for each Cath Lab visit)  Clinical Evaluation Leading to the Procedure:   ACS: Yes.    Non-ACS:    Anginal Classification: CCS IV  Anti-ischemic medical therapy: Minimal Therapy (1 class of medications)  Non-Invasive Test Results: No non-invasive testing performed  Prior CABG: No previous CABG      History and Physical Interval Note:  05/18/2016 10:34 AM  [name]  has presented today for surgery, with the diagnosis of cp  The various methods of treatment have been discussed with the patient and family. After consideration of risks, benefits and other options for treatment, the patient has consented to  Procedure(s): Left Heart Cath and Coronary Angiography (N/A) as a surgical intervention .  The patient's history has been reviewed, patient examined, no change in status, stable for surgery.  I have reviewed the patient's chart and labs.  Questions were answered to the patient's satisfaction.     Ryan [name]

## 2016-05-18 NOTE — Discharge Summary (Signed)
Discharge Summary    Patient ID: Ryan [name],  MRN: [id number], DOB/AGE: 08/14/1975 41 y.o.  Admit date: 05/17/2016 Discharge date: 05/18/2016  Primary Care Provider: No PCP Per Patient Primary Cardiologist: New to Dr Diona Browner  Discharge Diagnoses    Principal Problem:   NSTEMI (non-ST elevated myocardial infarction) [facility]) Active Problems:   Hyperlipidemia LDL goal <70   Essential hypertension, benign  Allergies Allergies  Allergen Reactions  . Bee Venom Anaphylaxis    Diagnostic Studies/Procedures    CARDIAC CATH: 05/18/2016 1. 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 Mild nonobstructive CAD involving the proximal coronary vessels 3. Normal LV function Recommendations:  Medical therapy for CAD (distal lesion not suitable for PCI and supplies small area of myocardium)  plavix 300 mg this am then 75 mg daily x 12 months  High-intensity statin  Stable for discharge home today _____________   History of Present Illness       41 y.o. male w/ hx Tob use (quit 02/2016), FH CAD, was admitted from [facility] ER with chest pain, elevated troponin.   Hospital Course     Consultants: None   Pt was pain-free on medical therapy. Cardiac cath was indicated and this was performed on 05/18/2016.   Results are above. Pt had a distal PDA stenosis that was felt to be the culprit lesion, a small vessel for which medical therapy is the only option.  A lipid profile was performed, results below. He is on high-dose statin, follow up as an outpatient. A beta blocker was also added to his medication regiment.   Post-cath, he was ambulating without chest pain or SOB. No further inpatient workup was indicated and he is considered stable for discharge, to follow up as an outpatient.  _____________  Discharge Vitals Blood pressure 115/87, pulse 82, temperature 97.7 F (36.5 C), temperature source Oral, resp. rate 20, height  (1.676  m), weight 187 lb (84.8 kg), SpO2 96 %.  Filed Weights   05/17/16 1211 05/17/16 1635 05/18/16 0435  Weight: 183 lb (83 kg) 189 lb 8 oz (86 kg) 187 lb (84.8 kg)    Labs & Radiologic Studies    CBC  Recent Labs  05/17/16 1238 05/18/16 0229  WBC 11.6* 11.0*  HGB 14.2 13.7  HCT 42.4 41.4  MCV 88.0 87.9  PLT 256 246   Basic Metabolic Panel  Recent Labs  05/17/16 1238 05/18/16 0229  NA 139 141  K 3.3* 4.0  CL 103 105  CO2 26 27  GLUCOSE 93 104*  BUN 21* 17  CREATININE 1.21 1.10  CALCIUM 9.0 9.1   Liver Function Tests  Recent Labs  05/18/16 0932  AST 24  ALT 29  ALKPHOS 38  BILITOT 0.5  PROT 6.4*  ALBUMIN 4.0   Cardiac Enzymes  Recent Labs  05/17/16 1238  TROPONINI 0.31*   Fasting Lipid Panel  Recent Labs  05/18/16 0229  CHOL 254*  HDL 34*  LDLCALC 150*  TRIG 348*  CHOLHDL 7.5   _____________  Dg Chest 2 View Result Date: 05/17/2016 CLINICAL DATA:  Left-sided chest pain for 2 days EXAM: CHEST  2 VIEW COMPARISON:  03/07/2013 FINDINGS: The heart size and mediastinal contours are within normal limits. Both lungs are clear. The visualized skeletal structures are unremarkable. IMPRESSION: No active cardiopulmonary disease. Electronically Signed   By: Alcide Clever M.D.   On: 05/17/2016 13:51   Disposition   Pt is being  discharged home today in good condition.  Follow-up Plans & Appointments    Follow-up Information    Joni Reining, NP Follow up on 05/28/2016.   Specialties:  Nurse Practitioner, Radiology, Cardiology Why:  Please arrive at 3:15 pm for a 3:30 pm appointment.  Contact information: [address] [place] [address] [phone]          Discharge Instructions    Diet - low sodium heart healthy    Complete by:  As directed    Increase activity slowly    Complete by:  As directed       Discharge Medications   Current Discharge Medication List    START taking these medications   Details  aspirin EC 81 MG EC tablet  Take 1 tablet (81 mg total) by mouth daily.    atorvastatin (LIPITOR) 80 MG tablet Take 1 tablet (80 mg total) by mouth daily at 6 PM. Qty: 30 tablet, Refills: 11    clopidogrel (PLAVIX) 75 MG tablet Take 1 tablet (75 mg total) by mouth daily with breakfast. Qty: 30 tablet, Refills: 11    metoprolol tartrate (LOPRESSOR) 25 MG tablet Take 1 tablet (25 mg total) by mouth 2 (two) times daily. Qty: 60 tablet, Refills: 11    nitroGLYCERIN (NITROSTAT) 0.4 MG SL tablet Place 1 tablet (0.4 mg total) under the tongue every 5 (five) minutes x 3 doses as needed for chest pain. Qty: 25 tablet, Refills: 3      CONTINUE these medications which have NOT CHANGED   Details  acetaminophen (TYLENOL) 500 MG tablet Take 1,000 mg by mouth every 6 (six) hours as needed.    OVER THE COUNTER MEDICATION Take 2 tablets by mouth 2 (two) times daily. Ultra test testosterone      STOP taking these medications     ibuprofen (ADVIL,MOTRIN) 600 MG tablet          Aspirin prescribed at discharge?  Yes High Intensity Statin Prescribed? (Lipitor 40-80mg  or Crestor 20-40mg ): Yes Beta Blocker Prescribed? Yes For EF <40%, was ACEI/ARB Prescribed? No: n/a ADP Receptor Inhibitor Prescribed? (i.e. Plavix etc.-Includes Medically Managed Patients): Yes For EF <40%, Aldosterone Inhibitor Prescribed? No: n/a Was EF assessed during THIS hospitalization? Yes Was Cardiac Rehab II ordered? (Included Medically managed Patients): Yes   Outstanding Labs/Studies   None   Duration of Discharge Encounter   Greater than 30 minutes including physician time.  Melida Quitter NP 05/18/2016, 2:31 PM

## 2016-05-18 NOTE — Progress Notes (Signed)
[name] to be D/C'd Home per MD order. Discussed with the patient and all questions fully answered.    VVS, Skin clean, dry and intact without evidence of skin break down, no evidence of skin tears noted.  IV catheter discontinued intact. Site without signs and symptoms of complications. Dressing and pressure applied.  An After Visit Summary was printed and given to the patient.  Patient escorted via WC, and D/C home via private auto.  Ryan [name]  05/18/2016 2:29 PM

## 2016-05-18 NOTE — Telephone Encounter (Signed)
Patient contacted regarding discharge from [facility] on 05/18/16.  Patient understands to follow up with provider K lawrence NP on 05/18/16 at 3:30 pm at [facility]. Patient understands discharge instructions? yes Patient understands medications and regiment? yes Patient understands to bring all medications to this visit? yes

## 2016-05-18 NOTE — Progress Notes (Signed)
ANTICOAGULATION CONSULT NOTE - Follow Up Consult  Pharmacy Consult for heparin Indication: chest pain/ACS  Allergies  Allergen Reactions  . Bee Venom Anaphylaxis    Patient Measurements: Height:  (167.6 cm) Weight: 187 lb (84.8 kg) IBW/kg (Calculated) : 63.8 Heparin Dosing Weight: 82 kg  Vital Signs: Temp: 97.7 F (36.5 C) (03/30 0435) Temp Source: Oral (03/30 0435) BP: 124/76 (03/30 0435) Pulse Rate: 70 (03/30 0435)  Labs:  Recent Labs  05/17/16 1238 05/17/16 2045 05/18/16 0229 05/18/16 0653  HGB 14.2  --  13.7  --   HCT 42.4  --  41.4  --   PLT 256  --  246  --   LABPROT  --   --   --  14.5  INR  --   --   --  1.12  HEPARINUNFRC  --  0.14* 0.51  --   CREATININE 1.21  --  1.10  --   TROPONINI 0.31*  --   --   --     Estimated Creatinine Clearance: 92.1 mL/min (by C-G formula based on SCr of 1.1 mg/dL).   Medications:  Infusions:  . sodium chloride 1 mL/kg/hr (05/18/16 0729)  . heparin 1,300 Units/hr (05/17/16 2234)    Assessment: 41 y/o male with family history of premature CAD who presented to [facility] ED with chest tightness, weakness, SOB, and radiation to left arm.   Heparin level = 0.51, CBC stable  Goal of Therapy:  Heparin level 0.3-0.7 units/ml Monitor platelets by anticoagulation protocol: Yes   Plan:  Continue heparin at 1300 units / hr Follow up AM labs  ? Possible cath today?  Thank you Okey Regal, PharmD [phone] -05/18/2016 9:08 AM

## 2016-05-18 NOTE — H&P (View-Only) (Signed)
Progress Note  Patient Name: Ryan [name] Date of Encounter: 05/18/2016  Primary Cardiologist: Dr Diona Browner  Patient Profile     41 y.o. male w/ hx Tob use (quit 02/2016), FH CAD, was admitted from [facility] ER with chest pain, elevated troponin.   Subjective   No CP or dyspnea this am. No c/o at rest. On a heparin gtt.  Inpatient Medications    Scheduled Meds: . aspirin EC  81 mg Oral Daily  . atorvastatin  80 mg Oral q1800  . metoprolol tartrate  25 mg Oral BID  . sodium chloride flush  3 mL Intravenous Q12H  . sodium chloride flush  3 mL Intravenous Q12H   Continuous Infusions: . sodium chloride 1 mL/kg/hr (05/18/16 0729)  . heparin 1,300 Units/hr (05/17/16 2234)   PRN Meds: sodium chloride, sodium chloride, acetaminophen, ALPRAZolam, nitroGLYCERIN, ondansetron (ZOFRAN) IV, sodium chloride flush, sodium chloride flush, zolpidem   Vital Signs    Vitals:   05/17/16 1500 05/17/16 1635 05/17/16 2113 05/18/16 0435  BP: (!) 146/101 (!) 145/88 136/85 124/76  Pulse: 97 72 82 70  Resp: Temp:  98.3 F (36.8 C) 98.2 F (36.8 C) 97.7 F (36.5 C)  TempSrc:  Oral Oral Oral  SpO2: 97% 98% 98% 97%  Weight:  189 lb 8 oz (86 kg)  187 lb (84.8 kg)  Height:   (1.676 m)      Intake/Output Summary (Last 24 hours) at 05/18/16 0920 Last data filed at 05/18/16 0438  Gross per 24 hour  Intake               40 ml  Output              950 ml  Net             -910 ml   Filed Weights   05/17/16 1211 05/17/16 1635 05/18/16 0435  Weight: 183 lb (83 kg) 189 lb 8 oz (86 kg) 187 lb (84.8 kg)    Telemetry    SR, no sig ectopy - Personally Reviewed  ECG    03/29, SR, inverted T waves inferior leads - Personally Reviewed  Physical Exam   General: Well developed, well nourished, male appearing in no acute distress. Head: Normocephalic, atraumatic.  Neck: Supple no JVD. Lungs:  Resp regular and unlabored, CTA. Heart: RRR, S1, S2, no S3, S4, or murmur; no  rub. Abdomen: Soft, non-tender, non-distended with normoactive bowel sounds. No hepatomegaly. No rebound/guarding. No obvious abdominal masses. Extremities: No clubbing, cyanosis, no edema. Distal pedal pulses are 2+ bilaterally. Neuro: Alert and oriented X 3. Moves all extremities spontaneously. Psych: Normal affect. Skin: extensive tattoos  Labs    Hematology  Recent Labs Lab 05/17/16 1238 05/18/16 0229  WBC 11.6* 11.0*  RBC 4.82 4.71  HGB 14.2 13.7  HCT 42.4 41.4  MCV 88.0 87.9  MCH 29.5 29.1  MCHC 33.5 33.1  RDW 12.6 12.8  PLT 256 246    Chemistry  Recent Labs Lab 05/17/16 1238 05/18/16 0229  NA 139 141  K 3.3* 4.0  CL 103 105  CO2 26 27  GLUCOSE 93 104*  BUN 21* 17  CREATININE 1.21 1.10  CALCIUM 9.0 9.1  GFRNONAA >60 >60  GFRAA >60 >60  ANIONGAP 10 9     Cardiac Enzymes  Recent Labs Lab 05/17/16 1238  TROPONINI 0.31*   Lab Results  Component Value Date   CHOL 254 (H) 05/18/2016   HDL  34 (L) 05/18/2016   LDLCALC 150 (H) 05/18/2016   TRIG 348 (H) 05/18/2016   CHOLHDL 7.5 05/18/2016     Radiology    Dg Chest 2 View  Result Date: 05/17/2016 CLINICAL DATA:  Left-sided chest pain for 2 days EXAM: CHEST  2 VIEW COMPARISON:  03/07/2013 FINDINGS: The heart size and mediastinal contours are within normal limits. Both lungs are clear. The visualized skeletal structures are unremarkable. IMPRESSION: No active cardiopulmonary disease. Electronically Signed   By: Alcide Clever M.D.   On: 05/17/2016 13:51     Cardiac Studies    CATH: 05/18/2016 pending   Patient Profile     41 y.o. male w/ hx Tob use (quit 02/2016), FH CAD, was admitted from [facility] ER with chest pain, elevated troponin.   Assessment & Plan     Principal Problem:   NSTEMI (non-ST elevated myocardial infarction) (HCC) - for cath today, further eval depending on the results. - on ASA, high- dose statin, BB  Active Problems:   Hyperlipidemia LDL goal <70 - continue high-dose  statin - ck LFTs     Essential hypertension, benign - BP control improved on BB   Signed, Leanna Battles 9:20 AM 05/18/2016 Pager: [phone]  Patient seen, examined. Available data reviewed. Agree with findings, assessment, and plan as outlined by Theodore Demark, PA-C. My exam is reflected above - changes made where appropriate. Pt with NSTEMI for cath +/- PCI today. Explained procedure and it's risks in detail with the patient and his family at bedside. His father had 6 vessel CABG at age 24 and the patient has an LDL of 150 and has been a smoker untiil January of this year. He understands he is at high risk of multivessel CAD. Continue IV heparin until cath. Continue high-intensity statin and beta blocker.  Tonny Bollman, M.D. 05/18/2016 9:37 AM

## 2016-05-18 NOTE — Progress Notes (Signed)
Progress Note  Patient Name: Ryan [name] Date of Encounter: 05/18/2016  Primary Cardiologist: Dr Diona Browner  Patient Profile     41 y.o. male w/ hx Tob use (quit 02/2016), FH CAD, was admitted from [facility] ER with chest pain, elevated troponin.   Subjective   No CP or dyspnea this am. No c/o at rest. On a heparin gtt.  Inpatient Medications    Scheduled Meds: . aspirin EC  81 mg Oral Daily  . atorvastatin  80 mg Oral q1800  . metoprolol tartrate  25 mg Oral BID  . sodium chloride flush  3 mL Intravenous Q12H  . sodium chloride flush  3 mL Intravenous Q12H   Continuous Infusions: . sodium chloride 1 mL/kg/hr (05/18/16 0729)  . heparin 1,300 Units/hr (05/17/16 2234)   PRN Meds: sodium chloride, sodium chloride, acetaminophen, ALPRAZolam, nitroGLYCERIN, ondansetron (ZOFRAN) IV, sodium chloride flush, sodium chloride flush, zolpidem   Vital Signs    Vitals:   05/17/16 1500 05/17/16 1635 05/17/16 2113 05/18/16 0435  BP: (!) 146/101 (!) 145/88 136/85 124/76  Pulse: 97 72 82 70  Resp: Temp:  98.3 F (36.8 C) 98.2 F (36.8 C) 97.7 F (36.5 C)  TempSrc:  Oral Oral Oral  SpO2: 97% 98% 98% 97%  Weight:  189 lb 8 oz (86 kg)  187 lb (84.8 kg)  Height:   (1.676 m)      Intake/Output Summary (Last 24 hours) at 05/18/16 0920 Last data filed at 05/18/16 0438  Gross per 24 hour  Intake               40 ml  Output              950 ml  Net             -910 ml   Filed Weights   05/17/16 1211 05/17/16 1635 05/18/16 0435  Weight: 183 lb (83 kg) 189 lb 8 oz (86 kg) 187 lb (84.8 kg)    Telemetry    SR, no sig ectopy - Personally Reviewed  ECG    03/29, SR, inverted T waves inferior leads - Personally Reviewed  Physical Exam   General: Well developed, well nourished, male appearing in no acute distress. Head: Normocephalic, atraumatic.  Neck: Supple no JVD. Lungs:  Resp regular and unlabored, CTA. Heart: RRR, S1, S2, no S3, S4, or murmur; no  rub. Abdomen: Soft, non-tender, non-distended with normoactive bowel sounds. No hepatomegaly. No rebound/guarding. No obvious abdominal masses. Extremities: No clubbing, cyanosis, no edema. Distal pedal pulses are 2+ bilaterally. Neuro: Alert and oriented X 3. Moves all extremities spontaneously. Psych: Normal affect. Skin: extensive tattoos  Labs    Hematology  Recent Labs Lab 05/17/16 1238 05/18/16 0229  WBC 11.6* 11.0*  RBC 4.82 4.71  HGB 14.2 13.7  HCT 42.4 41.4  MCV 88.0 87.9  MCH 29.5 29.1  MCHC 33.5 33.1  RDW 12.6 12.8  PLT 256 246    Chemistry  Recent Labs Lab 05/17/16 1238 05/18/16 0229  NA 139 141  K 3.3* 4.0  CL 103 105  CO2 26 27  GLUCOSE 93 104*  BUN 21* 17  CREATININE 1.21 1.10  CALCIUM 9.0 9.1  GFRNONAA >60 >60  GFRAA >60 >60  ANIONGAP 10 9     Cardiac Enzymes  Recent Labs Lab 05/17/16 1238  TROPONINI 0.31*   Lab Results  Component Value Date   CHOL 254 (H) 05/18/2016   HDL  34 (L) 05/18/2016   LDLCALC 150 (H) 05/18/2016   TRIG 348 (H) 05/18/2016   CHOLHDL 7.5 05/18/2016     Radiology    Dg Chest 2 View  Result Date: 05/17/2016 CLINICAL DATA:  Left-sided chest pain for 2 days EXAM: CHEST  2 VIEW COMPARISON:  03/07/2013 FINDINGS: The heart size and mediastinal contours are within normal limits. Both lungs are clear. The visualized skeletal structures are unremarkable. IMPRESSION: No active cardiopulmonary disease. Electronically Signed   By: Mark  Lukens M.D.   On: 05/17/2016 13:51     Cardiac Studies    CATH: 05/18/2016 pending   Patient Profile     41 y.o. male w/ hx Tob use (quit 02/2016), FH CAD, was admitted from [facility] ER with chest pain, elevated troponin.   Assessment & Plan     Principal Problem:   NSTEMI (non-ST elevated myocardial infarction) (HCC) - for cath today, further eval depending on the results. - on ASA, high- dose statin, BB  Active Problems:   Hyperlipidemia LDL goal <70 - continue high-dose  statin - ck LFTs     Essential hypertension, benign - BP control improved on BB   Signed, Barrett, Rhonda , PA-C 9:20 AM 05/18/2016 Pager: [phone]  Patient seen, examined. Available data reviewed. Agree with findings, assessment, and plan as outlined by Rhonda Barrett, PA-C. My exam is reflected above - changes made where appropriate. Pt with NSTEMI for cath +/- PCI today. Explained procedure and it's risks in detail with the patient and his family at bedside. His father had 6 vessel CABG at age 39 and the patient has an LDL of 150 and has been a smoker untiil January of this year. He understands he is at high risk of multivessel CAD. Continue IV heparin until cath. Continue high-intensity statin and beta blocker.  Yelina Sarratt, M.D. 05/18/2016 9:37 AM   

## 2016-05-18 NOTE — Discharge Instructions (Signed)
NO TOBACCO!! ° °PLEASE REMEMBER TO BRING ALL OF YOUR MEDICATIONS TO EACH OF YOUR FOLLOW-UP OFFICE VISITS. ° °PLEASE ATTEND ALL SCHEDULED FOLLOW-UP APPOINTMENTS.  ° °Activity: Increase activity slowly as tolerated. You may shower, but no soaking baths (or swimming) for 1 week. No driving for 1 week. No lifting over 5 lbs for 2 weeks. No sexual activity for 1 week.  ° °You May Return to Work: in 3 weeks (if applicable) ° °Wound Care: You may wash cath site gently with soap and water. Keep cath site clean and dry. If you notice pain, swelling, bleeding or pus at your cath site, please call [phone]. ° ° ° °Cardiac Cath Site Care °Refer to this sheet in the next few weeks. These instructions provide you with information on caring for yourself after your procedure. Your caregiver may also give you more specific instructions. Your treatment has been planned according to current medical practices, but problems sometimes occur. Call your caregiver if you have any problems or questions after your procedure. °HOME CARE INSTRUCTIONS °· You may shower 24 hours after the procedure. Remove the bandage (dressing) and gently wash the site with plain soap and water. Gently pat the site dry.  °· Do not apply powder or lotion to the site.  °· Do not sit in a bathtub, swimming pool, or whirlpool for 5 to 7 days.  °· No bending, squatting, or lifting anything over 10 pounds (4.5 kg) as directed by your caregiver.  °· Inspect the site at least twice daily.  °· Do not drive home if you are discharged the same day of the procedure. Have someone else drive you.  °· You may drive 24 hours after the procedure unless otherwise instructed by your caregiver.  °What to expect: °· Any bruising will usually fade within 1 to 2 weeks.  °· Blood that collects in the tissue (hematoma) may be painful to the touch. It should usually decrease in size and tenderness within 1 to 2 weeks.  °SEEK IMMEDIATE MEDICAL CARE IF: °· You have unusual pain at the site  or down the affected limb.  °· You have redness, warmth, swelling, or pain at the site.  °· You have drainage (other than a small amount of blood on the dressing).  °· You have chills.  °· You have a fever or persistent symptoms for more than 72 hours.  °· You have a fever and your symptoms suddenly get worse.  °· Your leg becomes pale, cool, tingly, or numb.  °· You have heavy bleeding from the site. Hold pressure on the site.  °Document Released: 03/10/2010 Document Revised: 01/25/2011 Document Reviewed:  ° °

## 2016-05-18 NOTE — Telephone Encounter (Signed)
-----   Message from Vallarie Mare sent at 05/18/2016  2:30 PM EDT ----- Pt is TCM f/u w/ [facility] on 4/9  Thank you!! :)

## 2016-05-21 ENCOUNTER — Telehealth: Payer: Self-pay | Admitting: Adult Health

## 2016-05-21 NOTE — Telephone Encounter (Signed)
Pt has questions concerning his diet restrictions and the meds he's started taking since being in the hospital

## 2016-05-21 NOTE — Telephone Encounter (Signed)
Patient questioned if 2,000 mg sodium was per meal or per day.I told him per day.he also c/o inability to sleep.I told him to talk to his pcp,found out he doesn't have one,wee will give him pcp list when he comes for follow up.He will also need work slip

## 2016-05-25 ENCOUNTER — Telehealth: Payer: Self-pay | Admitting: Adult Health

## 2016-05-25 NOTE — Telephone Encounter (Signed)
Will forward to Harriet Pho NP approve request

## 2016-05-25 NOTE — Telephone Encounter (Signed)
Pt made aware- Faxed note.

## 2016-05-25 NOTE — Telephone Encounter (Signed)
Patient states that he needs letter stating that he can not return to work until after appointment on 4/9 with Samara Deist. / tg

## 2016-05-25 NOTE — Telephone Encounter (Signed)
Ok to provide letter to patient.

## 2016-05-27 NOTE — Progress Notes (Signed)
Cardiology Office Note   Date:  05/28/2016   ID:  Ryan [name], DOB October 27, 1975, MRN [id number]  PCP:  No PCP Per Patient  Cardiologist: Nishan/   Joni Reining, NP   Chief Complaint  Patient presents with  . Follow-up    CAD post cath      History of Present Illness: Ryan [name] is a 41 y.o. male who presents for post hospitalization follow up after admission for NSTEMI requiring cardiac cath. This revealed severe stenosis of a small PDA branch, with mild non-obstructive CAD involving the proximal coronary vessels. He was recommended to be treated medically with DAPT using Plavix and ASA, started on BB and statin. He is here for TOC follow up. ,  He comes today with multiple questions about medications, bruising, the need to return to work with a letter, and diet. The patient has a great deal of anxiety over his health, has insomnia, and is on a lot of nutritional supplements. His wife accompanies him with multiple questions as well.  Past Medical History:  Diagnosis Date  . Bee sting allergy   . History of tobacco abuse     Past Surgical History:  Procedure Laterality Date  . LEFT HEART CATH AND CORONARY ANGIOGRAPHY N/A 05/18/2016   Procedure: Left Heart Cath and Coronary Angiography;  Surgeon: Tonny Bollman, MD;  Location: [facility] INVASIVE CV LAB;  Service: Cardiovascular;  Laterality: N/A;     Current Outpatient Prescriptions  Medication Sig Dispense Refill  . acetaminophen (TYLENOL) 500 MG tablet Take 1,000 mg by mouth every 6 (six) hours as needed.    Marland Kitchen aspirin EC 81 MG EC tablet Take 1 tablet (81 mg total) by mouth daily.    Marland Kitchen atorvastatin (LIPITOR) 80 MG tablet Take 1 tablet (80 mg total) by mouth daily at 6 PM. 30 tablet 11  . clopidogrel (PLAVIX) 75 MG tablet Take 1 tablet (75 mg total) by mouth daily with breakfast. 30 tablet 11  . metoprolol tartrate (LOPRESSOR) 25 MG tablet Take 1 tablet (25 mg total) by mouth 2 (two) times daily. 60 tablet 11  . nitroGLYCERIN  (NITROSTAT) 0.4 MG SL tablet Place 1 tablet (0.4 mg total) under the tongue every 5 (five) minutes x 3 doses as needed for chest pain. 25 tablet 3  . OVER THE COUNTER MEDICATION Take 2 tablets by mouth 2 (two) times daily. Ultra test testosterone    . metoprolol succinate (TOPROL XL) 25 MG 24 hr tablet Take 1 tablet (25 mg total) by mouth at bedtime. 90 tablet 3   No current facility-administered medications for this visit.     Allergies:   Bee venom    Social History:  The patient  reports that he quit smoking about 3 months ago. His smoking use included Cigarettes. He smoked 0.50 packs per day. He has never used smokeless tobacco. He reports that he does not drink alcohol or use drugs.   Family History:  The patient's family history includes CAD in his father and paternal grandmother.    ROS: All other systems are reviewed and negative. Unless otherwise mentioned in H&P    PHYSICAL EXAM: VS:  BP (!) 132/96   Pulse 72   Ht  (1.676 m)   Wt 185 lb (83.9 kg)   SpO2 98%   BMI 29.86 kg/m  , BMI Body mass index is 29.86 kg/m. GEN: Well nourished, well developed, in no acute distress  HEENT: normal  Neck: no JVD, carotid bruits, or masses  Cardiac: RRR; no murmurs, rubs, or gallops,no edema  Respiratory:  clear to auscultation bilaterally, normal work of breathing GI: soft, nontender, nondistended, + BS MS: no deformity or atrophy Right wrist catheter insertion site is well-healed. He does have areas of ecchymosis on the right pretibial area. Multiple tattoos Skin: warm and dry, no rash Neuro:  Strength and sensation are intact Psych: euthymic mood, full affect   Recent Labs: 05/18/2016: ALT 29; BUN 17; Creatinine, Ser 1.10; Hemoglobin 13.7; Platelets 246; Potassium 4.0; Sodium 141    Lipid Panel    Component Value Date/Time   CHOL 254 (H) 05/18/2016 0229   TRIG 348 (H) 05/18/2016 0229   HDL 34 (L) 05/18/2016 0229   CHOLHDL 7.5 05/18/2016 0229   VLDL 70 (H) 05/18/2016  0229   LDLCALC 150 (H) 05/18/2016 0229      Wt Readings from Last 3 Encounters:  05/28/16 185 lb (83.9 kg)  05/18/16 187 lb (84.8 kg)  12/25/13 170 lb (77.1 kg)      Other studies Reviewed: Cardiac Cath 05/18/2016 Conclusion   1. 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 Mild nonobstructive CAD involving the proximal coronary vessels 3. Normal LV function  Recommendations:  Medical therapy for CAD (distal lesion not suitable for PCI and supplies small area of myocardium)  plavix 300 mg this am then 75 mg daily x 12 months  High-intensity statin     ASSESSMENT AND PLAN:  1.  Coronary artery disease: Patient has a 95% distal PDA branch. This will be treated medically with beta blocker, statin, aspirin, and Plavix. I have answered multiple questions concerning his medications, lifestyle, and diet. He is given a letter to return to work. He is not to lift greater than 20 pounds for the first week. He can begin his workout again slowly 15 minutes a day and working his way back up to his usual 45 minutes.  2. Hypertension: Currently well-controlled on medication regimen. Will not make any changes in his regimen at this time. However, I will change his metoprolol from 25 mg twice a day 25 mg XL at at bedtime to prevent daytime fatigue.  3. Hypercholesterolemia: Continue statin therapy. He is advised on a low-sodium low-cholesterol diet. Multiple questions are answered.  4. Chronic anxiety: We'll defer to primary care for management, and referral to psychiatry if warranted   Current medicines are reviewed at length with the patient today.  I spent greater than 25 minutes with this patient and his wife answering multiple questions. He is referred to primary care physician for ongoing medical management.  Labs/ tests ordered today include:   Orders Placed This Encounter  Procedures  . Hepatic function panel  . Lipid Profile  .  Ambulatory referral to Family Practice     Disposition:   FU with 6 months  Signed, Joni Reining, NP  05/28/2016 5:24 PM    [facility] [address], [place], [place] [address] Phone: [phone]; Fax: [phone]

## 2016-05-28 ENCOUNTER — Encounter: Payer: Self-pay | Admitting: *Deleted

## 2016-05-28 ENCOUNTER — Encounter: Payer: Self-pay | Admitting: Adult Health

## 2016-05-28 ENCOUNTER — Ambulatory Visit (INDEPENDENT_AMBULATORY_CARE_PROVIDER_SITE_OTHER): Payer: 59 | Admitting: Adult Health

## 2016-05-28 VITALS — BP 132/96 | HR 72 | Ht 66.0 in | Wt 185.0 lb

## 2016-05-28 DIAGNOSIS — I2510 Atherosclerotic heart disease of native coronary artery without angina pectoris: Secondary | ICD-10-CM | POA: Diagnosis not present

## 2016-05-28 DIAGNOSIS — E782 Mixed hyperlipidemia: Secondary | ICD-10-CM

## 2016-05-28 DIAGNOSIS — I10 Essential (primary) hypertension: Secondary | ICD-10-CM

## 2016-05-28 MED ORDER — METOPROLOL SUCCINATE ER 25 MG PO TB24: 25.0000 mg | ORAL_TABLET | Freq: Every day | ORAL | 3 refills | Status: DC

## 2016-05-28 NOTE — Patient Instructions (Signed)
Your physician wants you to follow-up in: 6 Months with Dr. Eden Emms. You will receive a reminder letter in the mail two months in advance. If you don't receive a letter, please call our office to schedule the follow-up appointment.  Your physician has recommended you make the following change in your medication:  Start Toprol XL 25 mg At Bedtime  Your physician recommends that you return for lab work in: 6 Months just before your next visit.   You have been referred to Dr. Lily Peer   You have been given work note for work.  If you need a refill on your cardiac medications before your next appointment, please call your pharmacy.  Thank you for choosing [facility]!

## 2016-05-28 NOTE — Progress Notes (Signed)
Name: Ryan [name]    DOB: 03/22/1975  Age: 41 y.o.  MR#: [id number]       PCP:  No PCP Per Patient      Insurance: Payor: Advertising copywriter / Plan: Advertising copywriter OTHER / Product Type: *No Product type* /   CC:    Chief Complaint  Patient presents with  . Follow-up    CAD post cath    VS Vitals:   05/28/16 1554  BP: (!) 132/96  Pulse: 72  SpO2: 98%  Weight: 185 lb (83.9 kg)  Height:  (1.676 m)    Weights Current Weight  05/28/16 185 lb (83.9 kg)  05/18/16 187 lb (84.8 kg)  12/25/13 170 lb (77.1 kg)    Blood Pressure  BP Readings from Last 3 Encounters:  05/28/16 (!) 132/96  05/18/16 115/87  12/25/13 (!) 131/101     Admit date:  (Not on file) Last encounter with RMR:  05/25/2016   Allergy Bee venom  Current Outpatient Prescriptions  Medication Sig Dispense Refill  . acetaminophen (TYLENOL) 500 MG tablet Take 1,000 mg by mouth every 6 (six) hours as needed.    Marland Kitchen aspirin EC 81 MG EC tablet Take 1 tablet (81 mg total) by mouth daily.    Marland Kitchen atorvastatin (LIPITOR) 80 MG tablet Take 1 tablet (80 mg total) by mouth daily at 6 PM. 30 tablet 11  . clopidogrel (PLAVIX) 75 MG tablet Take 1 tablet (75 mg total) by mouth daily with breakfast. 30 tablet 11  . metoprolol tartrate (LOPRESSOR) 25 MG tablet Take 1 tablet (25 mg total) by mouth 2 (two) times daily. 60 tablet 11  . nitroGLYCERIN (NITROSTAT) 0.4 MG SL tablet Place 1 tablet (0.4 mg total) under the tongue every 5 (five) minutes x 3 doses as needed for chest pain. 25 tablet 3  . OVER THE COUNTER MEDICATION Take 2 tablets by mouth 2 (two) times daily. Ultra test testosterone     No current facility-administered medications for this visit.     Discontinued Meds:   There are no discontinued medications.  Patient Active Problem List   Diagnosis Date Noted  . NSTEMI (non-ST elevated myocardial infarction) (HCC) 05/17/2016  . MIXED HYPERLIPIDEMIA 03/24/2008  . Hyperlipidemia LDL goal <70 03/24/2008  . GENERALIZED  ANXIETY DISORDER 03/24/2008  . Essential hypertension, benign 03/24/2008  . MITRAL VALVE PROLAPSE 03/24/2008    LABS    Component Value Date/Time   NA 141 05/18/2016 0229   NA 139 05/17/2016 1238   NA 143 03/07/2013 1111   K 4.0 05/18/2016 0229   K 3.3 (L) 05/17/2016 1238   K 3.8 03/07/2013 1111   CL 105 05/18/2016 0229   CL 103 05/17/2016 1238   CL 99 03/07/2013 1111   CO2 27 05/18/2016 0229   CO2 26 05/17/2016 1238   CO2 28 03/07/2013 1111   GLUCOSE 104 (H) 05/18/2016 0229   GLUCOSE 93 05/17/2016 1238   GLUCOSE 88 03/07/2013 1111   BUN 17 05/18/2016 0229   BUN 21 (H) 05/17/2016 1238   BUN 10 03/07/2013 1111   CREATININE 1.10 05/18/2016 0229   CREATININE 1.21 05/17/2016 1238   CREATININE 1.10 03/07/2013 1111   CALCIUM 9.1 05/18/2016 0229   CALCIUM 9.0 05/17/2016 1238   CALCIUM 10.1 03/07/2013 1111   GFRNONAA >60 05/18/2016 0229   GFRNONAA >60 05/17/2016 1238   GFRNONAA 86 (L) 03/07/2013 1111   GFRAA >60 05/18/2016 0229   GFRAA >60 05/17/2016 1238   GFRAA >90 03/07/2013  1111   CMP     Component Value Date/Time   NA 141 05/18/2016 0229   K 4.0 05/18/2016 0229   CL 105 05/18/2016 0229   CO2 27 05/18/2016 0229   GLUCOSE 104 (H) 05/18/2016 0229   BUN 17 05/18/2016 0229   CREATININE 1.10 05/18/2016 0229   CALCIUM 9.1 05/18/2016 0229   PROT 6.4 (L) 05/18/2016 0932   ALBUMIN 4.0 05/18/2016 0932   AST 24 05/18/2016 0932   ALT 29 05/18/2016 0932   ALKPHOS 38 05/18/2016 0932   BILITOT 0.5 05/18/2016 0932   GFRNONAA >60 05/18/2016 0229   GFRAA >60 05/18/2016 0229       Component Value Date/Time   WBC 11.0 (H) 05/18/2016 0229   WBC 11.6 (H) 05/17/2016 1238   WBC 11.9 (H) 03/07/2013 1111   HGB 13.7 05/18/2016 0229   HGB 14.2 05/17/2016 1238   HGB 18.3 (H) 03/07/2013 1111   HCT 41.4 05/18/2016 0229   HCT 42.4 05/17/2016 1238   HCT 50.7 03/07/2013 1111   MCV 87.9 05/18/2016 0229   MCV 88.0 05/17/2016 1238   MCV 88.6 03/07/2013 1111    Lipid Panel      Component Value Date/Time   CHOL 254 (H) 05/18/2016 0229   TRIG 348 (H) 05/18/2016 0229   HDL 34 (L) 05/18/2016 0229   CHOLHDL 7.5 05/18/2016 0229   VLDL 70 (H) 05/18/2016 0229   LDLCALC 150 (H) 05/18/2016 0229    ABG No results found for: PHART, PCO2ART, PO2ART, HCO3, TCO2, ACIDBASEDEF, O2SAT   No results found for: TSH BNP (last 3 results) No results for input(s): BNP in the last 8760 hours.  ProBNP (last 3 results) No results for input(s): PROBNP in the last 8760 hours.  Cardiac Panel (last 3 results) No results for input(s): CKTOTAL, CKMB, TROPONINI, RELINDX in the last 72 hours.  Iron/TIBC/Ferritin/ %Sat No results found for: IRON, TIBC, FERRITIN, IRONPCTSAT   EKG Orders placed or performed during the hospital encounter of 05/17/16  . ED EKG within 10 minutes  . ED EKG within 10 minutes  . EKG 12-Lead  . EKG     Prior Assessment and Plan Problem List as of 05/28/2016 Never Reviewed     Cardiovascular and Mediastinum   Essential hypertension, benign   NSTEMI (non-ST elevated myocardial infarction) (HCC)     Other   MIXED HYPERLIPIDEMIA   Hyperlipidemia LDL goal <70   GENERALIZED ANXIETY DISORDER   MITRAL VALVE PROLAPSE       Imaging: Dg Chest 2 View  Result Date: 05/17/2016 CLINICAL DATA:  Left-sided chest pain for 2 days EXAM: CHEST  2 VIEW COMPARISON:  03/07/2013 FINDINGS: The heart size and mediastinal contours are within normal limits. Both lungs are clear. The visualized skeletal structures are unremarkable. IMPRESSION: No active cardiopulmonary disease. Electronically Signed   By: Alcide Clever M.D.   On: 05/17/2016 13:51

## 2016-06-27 ENCOUNTER — Ambulatory Visit (INDEPENDENT_AMBULATORY_CARE_PROVIDER_SITE_OTHER): Payer: 59 | Admitting: Family Medicine

## 2016-06-27 ENCOUNTER — Encounter: Payer: Self-pay | Admitting: Family Medicine

## 2016-06-27 VITALS — BP 114/78 | HR 68 | Temp 97.8°F | Resp 16 | Ht 66.0 in | Wt 189.1 lb

## 2016-06-27 DIAGNOSIS — E782 Mixed hyperlipidemia: Secondary | ICD-10-CM | POA: Diagnosis not present

## 2016-06-27 DIAGNOSIS — I10 Essential (primary) hypertension: Secondary | ICD-10-CM

## 2016-06-27 DIAGNOSIS — I214 Non-ST elevation (NSTEMI) myocardial infarction: Secondary | ICD-10-CM

## 2016-06-27 DIAGNOSIS — Z7689 Persons encountering health services in other specified circumstances: Secondary | ICD-10-CM | POA: Diagnosis not present

## 2016-06-27 DIAGNOSIS — E559 Vitamin D deficiency, unspecified: Secondary | ICD-10-CM

## 2016-06-27 DIAGNOSIS — F411 Generalized anxiety disorder: Secondary | ICD-10-CM

## 2016-06-27 MED ORDER — SERTRALINE HCL 25 MG PO TABS: ORAL_TABLET | ORAL | 1 refills | Status: DC

## 2016-06-27 NOTE — Progress Notes (Signed)
Chief Complaint  Patient presents with  . Establish Care  new to establish No prior PCP Had a MI in March this year Is under care cardiology Compliant with medicines No chest pain Has no tyet resumed exercise - but is advised to Is not smoking I snot up to date with immunizations declines HIV testing Wants to discuss increased irritability and short temper for last several months.  Trouble sleeping.  Patient Active Problem List   Diagnosis Date Noted  . NSTEMI (non-ST elevated myocardial infarction) (HCC) 05/17/2016  . MIXED HYPERLIPIDEMIA 03/24/2008  . Hyperlipidemia LDL goal <70 03/24/2008  . GENERALIZED ANXIETY DISORDER 03/24/2008  . Essential hypertension, benign 03/24/2008  . MITRAL VALVE PROLAPSE 03/24/2008    Outpatient Encounter Prescriptions as of 06/27/2016  Medication Sig  . acetaminophen (TYLENOL) 500 MG tablet Take 1,000 mg by mouth every 6 (six) hours as needed.  Marland Kitchen. aspirin EC 81 MG EC tablet Take 1 tablet (81 mg total) by mouth daily.  Marland Kitchen. atorvastatin (LIPITOR) 80 MG tablet Take 1 tablet (80 mg total) by mouth daily at 6 PM.  . clopidogrel (PLAVIX) 75 MG tablet Take 1 tablet (75 mg total) by mouth daily with breakfast.  . metoprolol succinate (TOPROL XL) 25 MG 24 hr tablet Take 1 tablet (25 mg total) by mouth at bedtime.  . nitroGLYCERIN (NITROSTAT) 0.4 MG SL tablet Place 1 tablet (0.4 mg total) under the tongue every 5 (five) minutes x 3 doses as needed for chest pain.  . Omega-3 Fatty Acids (FISH OIL) 1200 MG CAPS Take by mouth 2 (two) times daily.  Marland Kitchen. OVER THE COUNTER MEDICATION Take 2 tablets by mouth 2 (two) times daily. Ultra test testosterone  . ranitidine (ZANTAC) 150 MG tablet Take 150 mg by mouth 2 (two) times daily.  . sertraline (ZOLOFT) 25 MG tablet One a day for one week, After 7 days take 2 a day   No facility-administered encounter medications on file as of 06/27/2016.     Past Medical History:  Diagnosis Date  . Allergy    bees, pollen  .  Anxiety   . Bee sting allergy   . GERD (gastroesophageal reflux disease)    omeprazole  . Heart murmur   . History of tobacco abuse   . Hyperlipidemia   . Hypertension   . Myocardial infarction [facility](HCC) 2018    Past Surgical History:  Procedure Laterality Date  . LEFT HEART CATH AND CORONARY ANGIOGRAPHY N/A 05/18/2016   Procedure: Left Heart Cath and Coronary Angiography;  Surgeon: Tonny BollmanMichael Cooper, MD;  [facility];  Service: Cardiovascular;  Laterality: N/A;    Social History   Social History  . Marital status: Significant Other    Spouse name: Kirt BoysMolly  . Number of children: 2  . Years of education: 12   Occupational History  . Store Pharmacist, communityManager     [employer]   Social History Main Topics  . Smoking status: Former Smoker    Packs/day: 0.50    Types: Cigarettes    Quit date: 02/20/2016  . Smokeless tobacco: Never Used  . Alcohol use No  . Drug use: No  . Sexual activity: Yes    Birth control/ protection: None   Other Topics Concern  . Not on file   Social History Narrative   Divorced   Lives with Reynolds BowlMolly   Molly has 2 boys age 39 and 8010       Family History  Problem Relation Age of Onset  .  CAD Father     Premature CAD status post CABG and PCI  . Heart disease Father 46    Heart attack  . Hyperlipidemia Father   . Hypertension Father   . CAD Paternal Grandmother     Premature CAD  . Heart disease Paternal Grandmother   . Arthritis Mother   . Asthma Mother   . Diabetes Mother   . Heart disease Paternal Grandfather     Review of Systems  Constitutional: Negative for chills, fever and weight loss.  HENT: Negative for congestion and hearing loss.   Eyes: Negative for blurred vision and pain.  Respiratory: Negative for cough and shortness of breath.   Cardiovascular: Negative for chest pain and leg swelling.  Gastrointestinal: Negative for abdominal pain, constipation, diarrhea and heartburn.  Genitourinary: Negative for dysuria and frequency.   Musculoskeletal: Negative for falls, joint pain and myalgias.  Neurological: Negative for dizziness, seizures and headaches.  Psychiatric/Behavioral: Negative for depression. The patient is nervous/anxious and has insomnia.     BP 114/78 (BP Location: Right Arm, Patient Position: Sitting, Cuff Size: Normal)   Pulse 68   Temp 97.8 F (36.6 C) (Temporal)   Resp 16   Ht 5\' 6"  (1.676 m)   Wt 189 lb 1.3 oz (85.8 kg)   SpO2 98%   BMI 30.52 kg/m   Physical Exam  Constitutional: He is oriented to person, place, and time. He appears well-developed and well-nourished. No distress.  HENT:  Head: Normocephalic and atraumatic.  Eyes: Conjunctivae are normal. Pupils are equal, round, and reactive to light.  Cardiovascular: Normal rate, regular rhythm and normal heart sounds.   Pulmonary/Chest: Effort normal and breath sounds normal.  Neurological: He is alert and oriented to person, place, and time.  Psychiatric: He has a normal mood and affect. His behavior is normal.   ASSESSMENT/PLAN:  1. Generalized anxiety disorder  2. Essential hypertension, benign   3. NSTEMI (non-ST elevated myocardial infarction) (HCC) - CBC - COMPLETE METABOLIC PANEL WITH GFR - Lipid panel - Urinalysis, Routine w reflex microscopic  4. Mixed hyperlipidemia   5. Vitamin D deficiency   6. Encounter to establish care with new doctor Greater than 50% of this visit was spent in counseling and coordinating care.  Total face to face time:  45 min    Patient Instructions  Re start the exercise daily Eat a well balanced heart healthy diet  Start Zoloft daily one a day In one week take 2 a day Call or e-Mail for problems Take vitamin D 2000 u a day  See me in one month for 40 minutes    Eustace Moore, MD

## 2016-06-27 NOTE — Patient Instructions (Signed)
Re start the exercise daily Eat a well balanced heart healthy diet  Start Zoloft daily one a day In one week take 2 a day Call or e-Mail for problems Take vitamin D 2000 u a day  See me in one month for 40 minutes

## 2016-07-04 ENCOUNTER — Ambulatory Visit: Payer: 59 | Admitting: Family Medicine

## 2016-07-04 LAB — COMPLETE METABOLIC PANEL WITH GFR
ALT: 28 U/L (ref 9–46)
AST: 17 U/L (ref 10–40)
Albumin: 4.5 g/dL (ref 3.6–5.1)
Alkaline Phosphatase: 59 U/L (ref 40–115)
BILIRUBIN TOTAL: 0.5 mg/dL (ref 0.2–1.2)
BUN: 12 mg/dL (ref 7–25)
CHLORIDE: 102 mmol/L (ref 98–110)
CO2: 27 mmol/L (ref 20–31)
Calcium: 9.5 mg/dL (ref 8.6–10.3)
Creat: 1.03 mg/dL (ref 0.60–1.35)
GFR, Est African American: >89 mL/min (ref >=60)
GLUCOSE: 86 mg/dL (ref 65–99)
POTASSIUM: 4 mmol/L (ref 3.5–5.3)
SODIUM: 139 mmol/L (ref 135–146)
Total Protein: 7 g/dL (ref 6.1–8.1)

## 2016-07-04 LAB — URINALYSIS, ROUTINE W REFLEX MICROSCOPIC
BILIRUBIN URINE: NEGATIVE
GLUCOSE, UA: NEGATIVE
HGB URINE DIPSTICK: NEGATIVE
KETONES UR: NEGATIVE
Leukocytes, UA: NEGATIVE
Nitrite: NEGATIVE
PROTEIN: NEGATIVE
Specific Gravity, Urine: 1.017 (ref 1.001–1.035)
pH: 6 (ref 5.0–8.0)

## 2016-07-04 LAB — CBC
HCT: 43.8 % (ref 38.5–50.0)
Hemoglobin: 14.7 g/dL (ref 13.2–17.1)
MCH: 28.7 pg (ref 27.0–33.0)
MCHC: 33.6 g/dL (ref 32.0–36.0)
MCV: 85.5 fL (ref 80.0–100.0)
MPV: 9 fL (ref 7.5–12.5)
PLATELETS: 242 10*3/uL (ref 140–400)
RBC: 5.12 MIL/uL (ref 4.20–5.80)
RDW: 13.4 % (ref 11.0–15.0)
WBC: 8 10*3/uL (ref 3.8–10.8)

## 2016-07-04 LAB — LIPID PANEL
CHOL/HDL RATIO: 4.6 ratio (ref <5.0)
Cholesterol: 158 mg/dL (ref <200)
HDL: 34 mg/dL — AB (ref >40)
LDL CALC: 95 mg/dL (ref <100)
Triglycerides: 146 mg/dL (ref <150)
VLDL: 29 mg/dL (ref <30)

## 2016-07-04 LAB — VITAMIN D 25 HYDROXY (VIT D DEFICIENCY, FRACTURES): VIT D 25 HYDROXY: 26 ng/mL — AB (ref 30–100)

## 2016-07-06 ENCOUNTER — Encounter: Payer: Self-pay | Admitting: Family Medicine

## 2016-07-10 ENCOUNTER — Telehealth: Payer: Self-pay | Admitting: Adult Health

## 2016-07-10 MED ORDER — AMLODIPINE BESYLATE 5 MG PO TABS: 5.0000 mg | ORAL_TABLET | Freq: Every day | ORAL | 3 refills | Status: DC

## 2016-07-10 NOTE — Telephone Encounter (Signed)
Per pt phone call--BP has been running high this morning it was 137/98, last night it was 157/103--complains of headaches

## 2016-07-10 NOTE — Telephone Encounter (Signed)
Please call patient back regarding elevated BP's. / tg

## 2016-07-10 NOTE — Telephone Encounter (Signed)
Start amlodipine 5mg daily

## 2016-07-10 NOTE — Telephone Encounter (Signed)
Spoke with pt. He complains of headaches since Sunday afternoon and he has taken his blood pressures each day and has noticed it is starting to rise. ( 137/98, 157/103, 148/103, 152/106) He stated that he has had no weight gain, no sob or chest pian. He feels drained and sluggish but the main complaint was headaches. He was wondering if his medication needs adjusting. Pt has not saw a cardiologist in many years, but has been seeing Joni ReiningKathryn Lawrence, N.P. In her absence I will forward to Dr. Purvis SheffieldKoneswaran. Please advise.

## 2016-07-10 NOTE — Telephone Encounter (Signed)
Informed pt. Sent in Rx to [facility] in [place].

## 2016-07-18 ENCOUNTER — Other Ambulatory Visit: Payer: Self-pay

## 2016-07-18 ENCOUNTER — Telehealth: Payer: Self-pay | Admitting: Family Medicine

## 2016-07-18 ENCOUNTER — Other Ambulatory Visit: Payer: Self-pay | Admitting: Adult Health

## 2016-07-18 MED ORDER — CLOPIDOGREL BISULFATE 75 MG PO TABS: 75.0000 mg | ORAL_TABLET | Freq: Every day | ORAL | 1 refills | Status: DC

## 2016-07-18 MED ORDER — SERTRALINE HCL 50 MG PO TABS: 50.0000 mg | ORAL_TABLET | Freq: Every day | ORAL | 1 refills | Status: DC

## 2016-07-18 MED ORDER — ATORVASTATIN CALCIUM 80 MG PO TABS: 80.0000 mg | ORAL_TABLET | Freq: Every day | ORAL | 1 refills | Status: DC

## 2016-07-18 NOTE — Telephone Encounter (Signed)
Patient needs RX for Lipitor and Pl[facility]ix [facility]  / was told by [facility] if we could call in today they could have it to him tomorrow / tg

## 2016-07-18 NOTE — Telephone Encounter (Signed)
E-scribed refills to [facility]

## 2016-07-18 NOTE — Telephone Encounter (Signed)
Patient calling to say that there has been a change in his insurance and prescriptions.  He must use [facility] now.  Please note for further scripts.

## 2016-07-18 NOTE — Telephone Encounter (Signed)
Please send all RX's from our office to [facility]. MD line is [phone]. / tg

## 2016-07-18 NOTE — Telephone Encounter (Signed)
Done

## 2016-07-24 ENCOUNTER — Encounter: Payer: Self-pay | Admitting: Adult Health

## 2016-07-24 ENCOUNTER — Encounter: Payer: Self-pay | Admitting: Family Medicine

## 2016-07-25 ENCOUNTER — Telehealth: Payer: Self-pay | Admitting: Adult Health

## 2016-07-25 ENCOUNTER — Other Ambulatory Visit: Payer: Self-pay

## 2016-07-25 ENCOUNTER — Telehealth: Payer: Self-pay

## 2016-07-25 MED ORDER — SERTRALINE HCL 100 MG PO TABS: 100.0000 mg | ORAL_TABLET | Freq: Every day | ORAL | 3 refills | Status: DC

## 2016-07-25 MED ORDER — METOPROLOL SUCCINATE ER 25 MG PO TB24: 25.0000 mg | ORAL_TABLET | Freq: Every day | ORAL | 3 refills | Status: DC

## 2016-07-25 NOTE — Telephone Encounter (Signed)
Called [facility], clarified zoloft dose of 100 mg po daily, new rx sent to [facility].

## 2016-07-25 NOTE — Telephone Encounter (Signed)
Refill sent.

## 2016-07-25 NOTE — Telephone Encounter (Signed)
New rx sent

## 2016-07-25 NOTE — Telephone Encounter (Signed)
Needs refill on Toprol sent to [facility]. Please call patient for medication question. / tg

## 2016-07-26 ENCOUNTER — Other Ambulatory Visit: Payer: Self-pay | Admitting: Cardiovascular Disease

## 2016-07-26 NOTE — Telephone Encounter (Signed)
Pt has enough meds left until mail order comes,disregard his email

## 2016-07-30 ENCOUNTER — Other Ambulatory Visit: Payer: Self-pay | Admitting: Adult Health

## 2016-07-30 MED ORDER — AMLODIPINE BESYLATE 5 MG PO TABS: 5.0000 mg | ORAL_TABLET | Freq: Every day | ORAL | 3 refills | Status: DC

## 2016-07-30 NOTE — Telephone Encounter (Signed)
Amlodipine rx moved to [facility]

## 2016-07-30 NOTE — Telephone Encounter (Signed)
Per pt phone call--pt has not received his amLODipine (NORVASC) 5 MG tablet [[id number]][[id number]].   [facility]

## 2016-08-08 ENCOUNTER — Ambulatory Visit (INDEPENDENT_AMBULATORY_CARE_PROVIDER_SITE_OTHER): Payer: 59 | Admitting: Family Medicine

## 2016-08-08 ENCOUNTER — Encounter: Payer: Self-pay | Admitting: Family Medicine

## 2016-08-08 VITALS — BP 118/80 | HR 60 | Temp 98.3°F | Resp 18 | Ht 66.0 in | Wt 192.1 lb

## 2016-08-08 DIAGNOSIS — F5104 Psychophysiologic insomnia: Secondary | ICD-10-CM | POA: Insufficient documentation

## 2016-08-08 DIAGNOSIS — I10 Essential (primary) hypertension: Secondary | ICD-10-CM

## 2016-08-08 DIAGNOSIS — E785 Hyperlipidemia, unspecified: Secondary | ICD-10-CM | POA: Diagnosis not present

## 2016-08-08 DIAGNOSIS — M545 Low back pain, unspecified: Secondary | ICD-10-CM | POA: Insufficient documentation

## 2016-08-08 DIAGNOSIS — I2510 Atherosclerotic heart disease of native coronary artery without angina pectoris: Secondary | ICD-10-CM | POA: Insufficient documentation

## 2016-08-08 DIAGNOSIS — Z0000 Encounter for general adult medical examination without abnormal findings: Secondary | ICD-10-CM

## 2016-08-08 MED ORDER — ZOLPIDEM TARTRATE 10 MG PO TABS: 10.0000 mg | ORAL_TABLET | Freq: Every evening | ORAL | 1 refills | Status: DC | PRN

## 2016-08-08 NOTE — Progress Notes (Signed)
Chief Complaint  Patient presents with  . Annual Exam   No complaint Compliant with medicine and diet Is exercising regularly Weight stable No chest pain or DOE Complains of insomnia Not responding to OTC meds.  Sleep hygiene reviewed.  He requests medicine.  We discussed that he is to use this as needed, NOT daily   Patient Active Problem List   Diagnosis Date Noted  . Low back pain 08/08/2016  . Chronic insomnia 08/08/2016  . CAD (coronary artery disease) 08/08/2016  . NSTEMI (non-ST elevated myocardial infarction) (HCC) 05/17/2016  . Hyperlipidemia LDL goal <70 03/24/2008  . GENERALIZED ANXIETY DISORDER 03/24/2008  . Essential hypertension, benign 03/24/2008  . MITRAL VALVE PROLAPSE 03/24/2008    Outpatient Encounter Prescriptions as of 08/08/2016  Medication Sig  . acetaminophen (TYLENOL) 500 MG tablet Take 1,000 mg by mouth every 6 (six) hours as needed.  Marland Kitchen. amLODipine (NORVASC) 5 MG tablet Take 1 tablet (5 mg total) by mouth daily.  Marland Kitchen. aspirin EC 81 MG EC tablet Take 1 tablet (81 mg total) by mouth daily.  Marland Kitchen. atorvastatin (LIPITOR) 80 MG tablet Take 1 tablet (80 mg total) by mouth daily at 6 PM.  . cholecalciferol (VITAMIN D) 1000 units tablet Take 2,000 Units by mouth daily.  . clopidogrel (PLAVIX) 75 MG tablet TAKE 1 TABLET BY MOUTH EVERY DAY  . metoprolol succinate (TOPROL XL) 25 MG 24 hr tablet Take 1 tablet (25 mg total) by mouth at bedtime.  . nitroGLYCERIN (NITROSTAT) 0.4 MG SL tablet Place 1 tablet (0.4 mg total) under the tongue every 5 (five) minutes x 3 doses as needed for chest pain.  . Omega-3 Fatty Acids (FISH OIL) 1200 MG CAPS Take by mouth 2 (two) times daily.  Marland Kitchen. OVER THE COUNTER MEDICATION Take 2 tablets by mouth 2 (two) times daily. Ultra test testosterone  . ranitidine (ZANTAC) 150 MG tablet Take 150 mg by mouth as needed.   . sertraline (ZOLOFT) 100 MG tablet Take 1 tablet (100 mg total) by mouth daily.  Marland Kitchen. zolpidem (AMBIEN) 10 MG tablet Take 1 tablet  (10 mg total) by mouth at bedtime as needed for sleep.   No facility-administered encounter medications on file as of 08/08/2016.     Allergies  Allergen Reactions  . Bee Venom Anaphylaxis    Review of Systems  Constitutional: Negative for activity change, appetite change, fatigue and unexpected weight change.  HENT: Negative for congestion and dental problem.   Eyes: Negative for photophobia and visual disturbance.  Respiratory: Negative for cough.   Cardiovascular: Negative for chest pain, palpitations and leg swelling.  Gastrointestinal: Negative for constipation and diarrhea.  Genitourinary: Negative for difficulty urinating and frequency.  Musculoskeletal: Positive for back pain. Negative for arthralgias.       Occas  Skin: Negative for rash.  Neurological: Negative for dizziness and headaches.  Psychiatric/Behavioral: Positive for sleep disturbance. Negative for dysphoric mood. The patient is not nervous/anxious.     BP 118/80 (BP Location: Right Arm, Patient Position: Sitting, Cuff Size: Large)   Pulse 60   Temp 98.3 F (36.8 C) (Temporal)   Resp 18   Ht 5\' 6"  (1.676 m)   Wt 192 lb 1.9 oz (87.1 kg)   SpO2 98%   BMI 31.01 kg/m   Physical Exam  BP 118/80 (BP Location: Right Arm, Patient Position: Sitting, Cuff Size: Large)   Pulse 60   Temp 98.3 F (36.8 C) (Temporal)   Resp 18   Ht 5\' 6"  (  1.676 m)   Wt 192 lb 1.9 oz (87.1 kg)   SpO2 98%   BMI 31.01 kg/m   General Appearance:    Alert, cooperative, no distress, appears stated age  Head:    Normocephalic, without obvious abnormality, atraumatic  Eyes:    PERRL, conjunctiva/corneas clear, EOM's intact, fundi    benign, both eyes       Ears:    Normal TM's and external ear canals, both ears  Nose:   Nares normal, septum midline, mucosa normal, no drainage   or sinus tenderness  Throat:   Lips, mucosa, and tongue normal; teeth and gums normal  Neck:   Supple, symmetrical, trachea midline, no adenopathy;        thyroid:  No enlargement/tenderness/nodules; no carotid   bruit   Back:     Symmetric, no curvature, ROM normal, no CVA tenderness  Lungs:     Clear to auscultation bilaterally, respirations unlabored  Chest wall:    No tenderness or deformity  Heart:    Regular rate and rhythm, S1 and S2 normal, no murmur, rub   or gallop  Abdomen:     Soft, non-tender, bowel sounds active all four quadrants,    no masses, no organomegaly  Genitalia:    Normal male without lesion, discharge or tenderness  Extremities:   Extremities normal, atraumatic, no cyanosis or edema  Pulses:   2+ and symmetric all extremities  Skin:   Skin color, texture, turgor normal, no rashes or lesions.  Many tattoos  Lymph nodes:   Cervical, supraclavicular, and axillary nodes normal  Neurologic:   CNII-XII intact. Normal strength, sensation and reflexes      throughout     ASSESSMENT/PLAN:  1. Chronic insomnia Trial of ambien.  Rx to last 3 months  2. Essential hypertension, benign controlled  3. Hyperlipidemia LDL goal <70 On max dose lipitor  4. Coronary artery disease involving native coronary artery of native heart without angina pectoris Stents.  Asymptomatic - CBC - COMPLETE METABOLIC PANEL WITH GFR - Lipid panel - VITAMIN D 25 Hydroxy (Vit-D Deficiency, Fractures) - Urinalysis, Routine w reflex microscopic  5. PE (physical exam), annual No abnormal findings   Patient Instructions  Take the zolpidem Remus Loffler) as directed when needed for sleep Need blood work and re check in six months Stay as active as you can manage   Eustace Moore, MD

## 2016-08-08 NOTE — Patient Instructions (Signed)
Take the zolpidem Ryan [name]( ambien) as directed when needed for sleep Need blood work and re check in six months Stay as active as you can manage

## 2016-09-05 ENCOUNTER — Encounter: Payer: Self-pay | Admitting: Family Medicine

## 2016-09-06 ENCOUNTER — Other Ambulatory Visit: Payer: Self-pay | Admitting: Family Medicine

## 2016-09-06 MED ORDER — IBUPROFEN 800 MG PO TABS: 800.0000 mg | ORAL_TABLET | Freq: Three times a day (TID) | ORAL | 0 refills | Status: DC | PRN

## 2016-09-07 ENCOUNTER — Other Ambulatory Visit: Payer: Self-pay | Admitting: Family Medicine

## 2016-09-07 ENCOUNTER — Other Ambulatory Visit: Payer: Self-pay

## 2016-09-07 MED ORDER — CYCLOBENZAPRINE HCL 10 MG PO TABS: 10.0000 mg | ORAL_TABLET | Freq: Three times a day (TID) | ORAL | 0 refills | Status: DC | PRN

## 2016-10-01 ENCOUNTER — Other Ambulatory Visit: Payer: Self-pay

## 2016-10-01 MED ORDER — ZOLPIDEM TARTRATE 10 MG PO TABS: 10.0000 mg | ORAL_TABLET | Freq: Every evening | ORAL | 0 refills | Status: DC | PRN

## 2016-11-20 ENCOUNTER — Other Ambulatory Visit: Payer: Self-pay | Admitting: Adult Health

## 2016-11-25 NOTE — Progress Notes (Signed)
Cardiology Office Note   Date:  11/26/2016   ID:  Ryan [name], DOB Nov 28, 1975, MRN [id number]  PCP:  Eustace Moore, MD  Cardiologist:  Charlton Haws   Chief Complaint  Patient presents with  . Coronary Artery Disease  . Hypertension  . Hyperlipidemia   History of Present Illness: Ryan [name] is a 41 y.o. male who presents for ongoing assessment and management of CAD, with severe stenosis of a small PDA of 95%,  and mild non-obstructive disease elsewhere, and treated medically with DAPT BB and statin therapy. Multiple questions were answered. He is here for 6 month follow up.  He is doing well. No complaints of chest pain. He is medically compliant. He has gained some weight since last appointment. He states that he feels more tired. He does not have an exercise routine.   Past Medical History:  Diagnosis Date  . Allergy    bees, pollen  . Anxiety   . Bee sting allergy   . GERD (gastroesophageal reflux disease)    omeprazole  . Heart murmur   . History of tobacco abuse   . Hyperlipidemia   . Hypertension   . Myocardial infarction [facility]) 2018    Past Surgical History:  Procedure Laterality Date  . LEFT HEART CATH AND CORONARY ANGIOGRAPHY N/A 05/18/2016   Procedure: Left Heart Cath and Coronary Angiography;  Surgeon: Tonny Bollman, MD;  Location: [facility] INVASIVE CV LAB;  Service: Cardiovascular;  Laterality: N/A;     Current Outpatient Prescriptions  Medication Sig Dispense Refill  . acetaminophen (TYLENOL) 500 MG tablet Take 1,000 mg by mouth every 6 (six) hours as needed.    Marland Kitchen aspirin EC 81 MG EC tablet Take 1 tablet (81 mg total) by mouth daily.    Marland Kitchen atorvastatin (LIPITOR) 80 MG tablet TAKE 1 TABLET BY MOUTH  DAILY AT 6 PM. 90 tablet 2  . cholecalciferol (VITAMIN D) 1000 units tablet Take 2,000 Units by mouth daily.    . clopidogrel (PLAVIX) 75 MG tablet TAKE 1 TABLET BY MOUTH EVERY DAY 5 tablet 0  . clopidogrel (PLAVIX) 75 MG tablet TAKE 1 TABLET BY MOUTH  DAILY WITH  BREAKFAST 90 tablet 2  . cyclobenzaprine (FLEXERIL) 10 MG tablet Take 1 tablet (10 mg total) by mouth 3 (three) times daily as needed for muscle spasms. 30 tablet 0  . metoprolol succinate (TOPROL XL) 25 MG 24 hr tablet Take 1 tablet (25 mg total) by mouth at bedtime. 90 tablet 3  . nitroGLYCERIN (NITROSTAT) 0.4 MG SL tablet Place 1 tablet (0.4 mg total) under the tongue every 5 (five) minutes x 3 doses as needed for chest pain. 25 tablet 3  . Omega-3 Fatty Acids (FISH OIL) 1200 MG CAPS Take by mouth 2 (two) times daily.    Marland Kitchen OVER THE COUNTER MEDICATION Take 2 tablets by mouth 2 (two) times daily. Ultra test testosterone    . ranitidine (ZANTAC) 150 MG tablet Take 150 mg by mouth as needed.     . sertraline (ZOLOFT) 100 MG tablet Take 1 tablet (100 mg total) by mouth daily. 90 tablet 3  . amLODipine (NORVASC) 5 MG tablet Take 1 tablet (5 mg total) by mouth daily. 90 tablet 3  . zolpidem (AMBIEN) 10 MG tablet Take 1 tablet (10 mg total) by mouth at bedtime as needed for sleep. 30 tablet 0   No current facility-administered medications for this visit.     Allergies:   Bee venom    Social  History:  The patient  reports that he quit smoking about 9 months ago. His smoking use included Cigarettes. He smoked 0.50 packs per day. He has never used smokeless tobacco. He reports that he does not drink alcohol or use drugs.   Family History:  The patient's family history includes Arthritis in his mother; Asthma in his mother; CAD in his father and paternal grandmother; Diabetes in his mother; Heart disease in his paternal grandfather and paternal grandmother; Heart disease (age of onset: 66) in his father; Hyperlipidemia in his father; Hypertension in his father.    ROS: All other systems are reviewed and negative. Unless otherwise mentioned in H&P    PHYSICAL EXAM: VS:  BP 132/78   Pulse 77   Ht  (1.676 m)   Wt 208 lb (94.3 kg)   SpO2 94%   BMI 33.57 kg/m  , BMI Body mass index is 33.57  kg/m. GEN: Well nourished, well developed, in no acute distress  HEENT: normal  Neck: no JVD, carotid bruits, or masses Cardiac: RRR; no murmurs, rubs, or gallops,no edema  Respiratory:  clear to auscultation bilaterally, normal work of breathing GI: soft, nontender, nondistended, + BS MS: no deformity or atrophy  Skin: warm and dry, no rash Neuro:  Strength and sensation are intact Psych: euthymic mood, full affect   Recent Labs: 07/04/2016: ALT 28; BUN 12; Creat 1.03; Hemoglobin 14.7; Platelets 242; Potassium 4.0; Sodium 139    Lipid Panel    Component Value Date/Time   CHOL 158 07/04/2016 0747   TRIG 146 07/04/2016 0747   HDL 34 (L) 07/04/2016 0747   CHOLHDL 4.6 07/04/2016 0747   VLDL 29 07/04/2016 0747   LDLCALC 95 07/04/2016 0747      Wt Readings from Last 3 Encounters:  11/26/16 208 lb (94.3 kg)  08/08/16 192 lb 1.9 oz (87.1 kg)  06/27/16 189 lb 1.3 oz (85.8 kg)      Other studies Reviewed: Cardiac Cath 05/18/2016 Conclusion   1. 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 Mild nonobstructive CAD involving the proximal coronary vessels 3. Normal LV function  Recommendations:  Medical therapy for CAD (distal lesion not suitable for PCI and supplies small area of myocardium)  plavix 300 mg this am then 75 mg daily x 12 months  High-intensity statin  Stable for discharge home today      ASSESSMENT AND PLAN:  1.  Coronary artery disease: Continue medical therapy with beta blocker, secondary prevention with statin therapy continue dual antiplatelet therapy. Blood pressure is well-controlled. He has gained approximately 20 pounds since being seen in May of this year. Weight going from 189 03/23/2006. I've advised him to increase his exercise regimen as this will help with cholesterol, cardiovascular health, and weight loss. He verbalizes understanding.  2. Hypertension: Blood pressure is very well controlled  currently. Despite weight gain the patient is managing this well. Continue current medication regimen.  3. Hypercholesterolemia: Continue statin therapy. Labs are performed by primary care physician Dr. Delton See. Most recent labs completed in May 2018. We'll need to repeat these in December 2018.  Current medicines are reviewed at length with the patient today.    Labs/ tests ordered today include:  Bettey Mare. Liborio Nixon, ANP, AACC   11/26/2016 1:13 PM    [facility] [address] [place], [place] [address] Phone: [phone]; Fax: [phone]

## 2016-11-26 ENCOUNTER — Ambulatory Visit (INDEPENDENT_AMBULATORY_CARE_PROVIDER_SITE_OTHER): Payer: 59 | Admitting: Adult Health

## 2016-11-26 ENCOUNTER — Encounter: Payer: Self-pay | Admitting: Adult Health

## 2016-11-26 ENCOUNTER — Other Ambulatory Visit: Payer: Self-pay

## 2016-11-26 VITALS — BP 132/78 | HR 77 | Ht 66.0 in | Wt 208.0 lb

## 2016-11-26 DIAGNOSIS — I2510 Atherosclerotic heart disease of native coronary artery without angina pectoris: Secondary | ICD-10-CM | POA: Diagnosis not present

## 2016-11-26 DIAGNOSIS — E7800 Pure hypercholesterolemia, unspecified: Secondary | ICD-10-CM

## 2016-11-26 DIAGNOSIS — I10 Essential (primary) hypertension: Secondary | ICD-10-CM | POA: Diagnosis not present

## 2016-11-26 MED ORDER — ZOLPIDEM TARTRATE 10 MG PO TABS: 10.0000 mg | ORAL_TABLET | Freq: Every evening | ORAL | 0 refills | Status: AC | PRN

## 2016-11-26 NOTE — Telephone Encounter (Signed)
Seen 6 20 18

## 2016-11-26 NOTE — Patient Instructions (Signed)
Medication Instructions:  Your physician recommends that you continue on your current medications as directed. Please refer to the Current Medication list given to you today.   Labwork: NONE   Testing/Procedures: NONE   Follow-Up: Your physician wants you to follow-up in: 1 Year with Dr. Nishan. You will receive a reminder letter in the mail two months in advance. If you don't receive a letter, please call our office to schedule the follow-up appointment.   Any Other Special Instructions Will Be Listed Below (If Applicable).     If you need a refill on your cardiac medications before your next appointment, please call your pharmacy. Thank you for choosing  [facility]!    

## 2017-01-24 ENCOUNTER — Telehealth: Payer: Self-pay | Admitting: Family Medicine

## 2017-01-24 NOTE — Telephone Encounter (Signed)
Called to r/s appt, no answer, no voicemail. mailed out reminder w/ new appt date.

## 2017-02-06 ENCOUNTER — Ambulatory Visit: Payer: 59 | Admitting: Family Medicine

## 2017-02-13 ENCOUNTER — Encounter: Payer: Self-pay | Admitting: Family Medicine

## 2017-02-14 ENCOUNTER — Ambulatory Visit: Payer: 59 | Admitting: Family Medicine

## 2017-03-25 ENCOUNTER — Ambulatory Visit: Payer: 59 | Admitting: Family Medicine

## 2017-04-21 ENCOUNTER — Other Ambulatory Visit: Payer: Self-pay | Admitting: Family Medicine

## 2017-04-21 ENCOUNTER — Other Ambulatory Visit: Payer: Self-pay | Admitting: Adult Health

## 2017-04-22 NOTE — Telephone Encounter (Signed)
Seen 6 20 18

## 2017-04-29 ENCOUNTER — Encounter: Payer: Self-pay | Admitting: Family Medicine

## 2017-05-01 ENCOUNTER — Other Ambulatory Visit: Payer: Self-pay | Admitting: Family Medicine

## 2017-07-08 IMAGING — DX DG CHEST 2V
2 series · 2 of 2 positions shown · non-contrast
Comparison: 03/07/2013

CLINICAL DATA: Left-sided chest pain for 2 days

EXAM:
CHEST  2 VIEW

[chest pa]
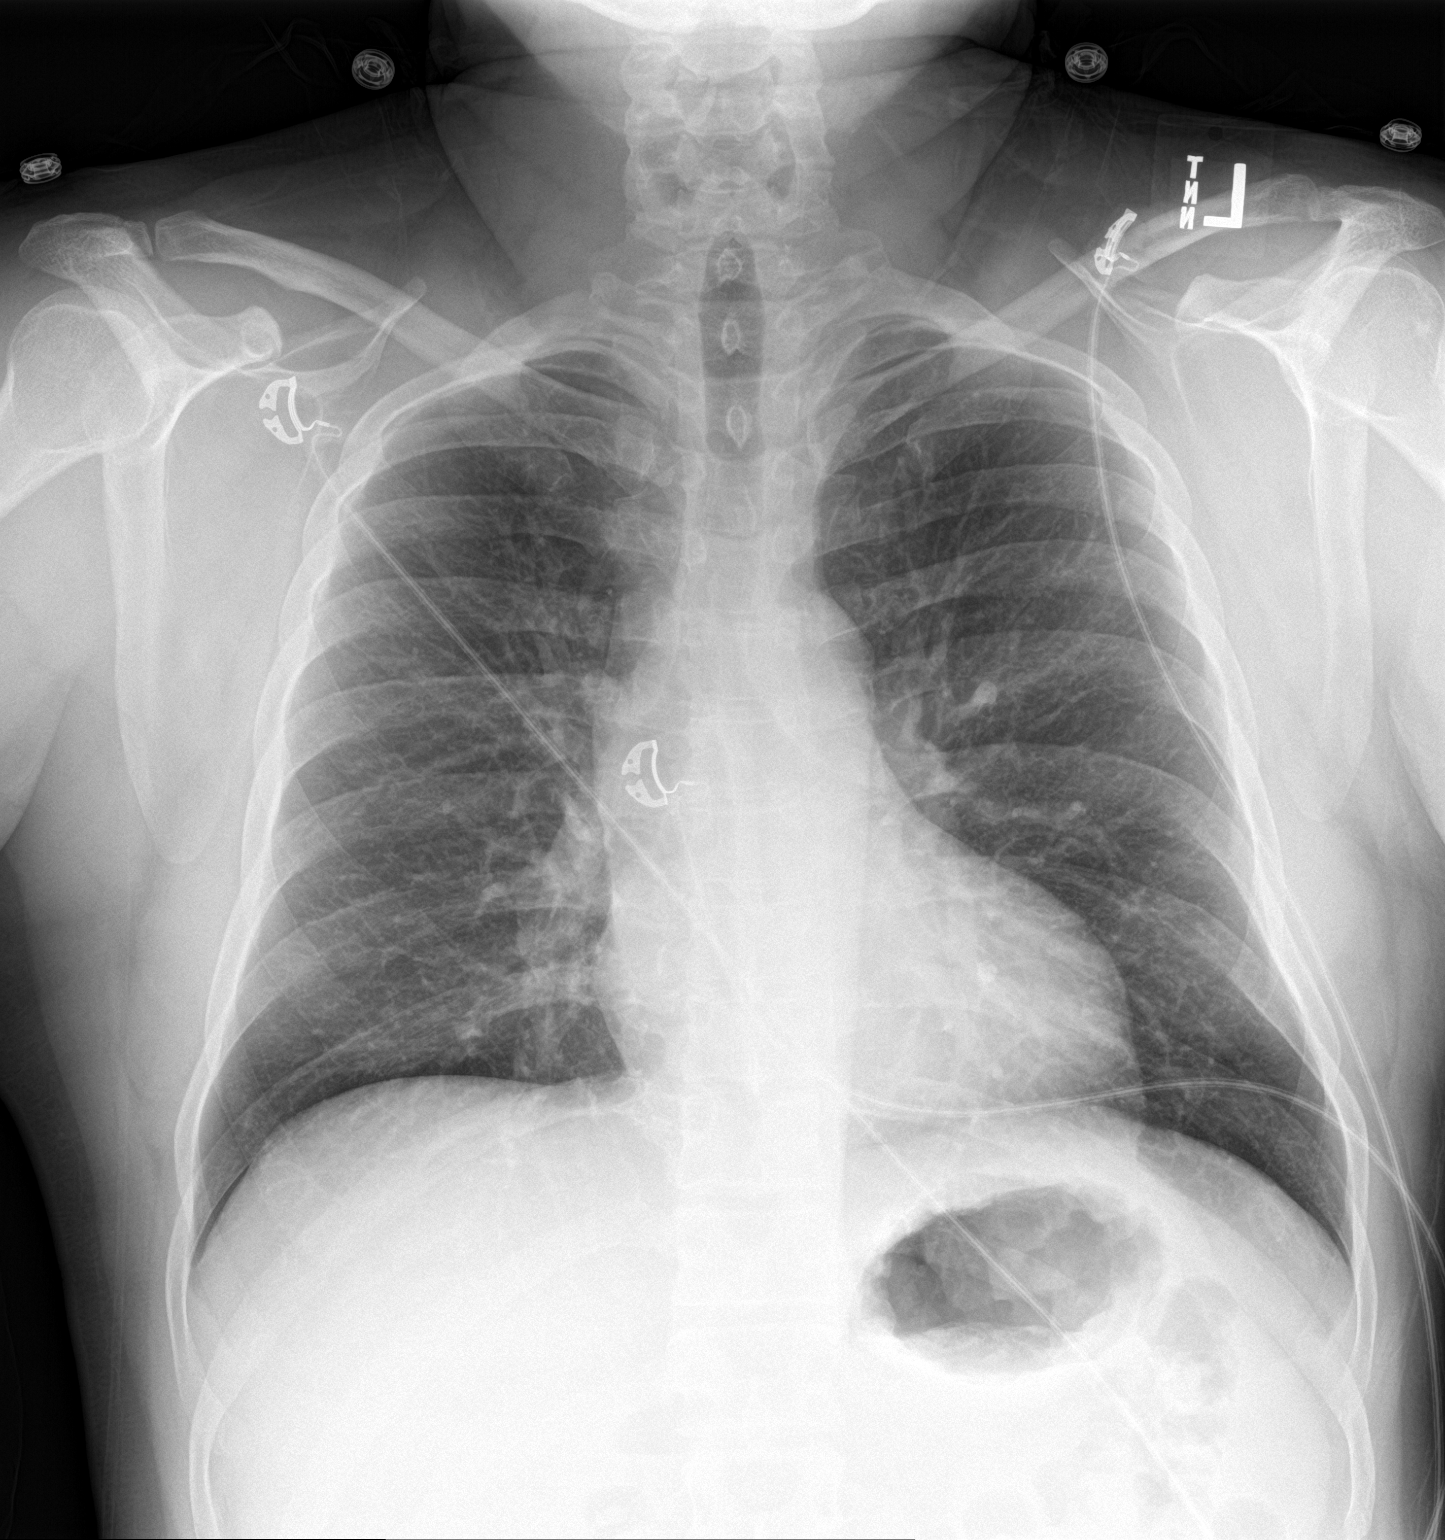

[chest lat]
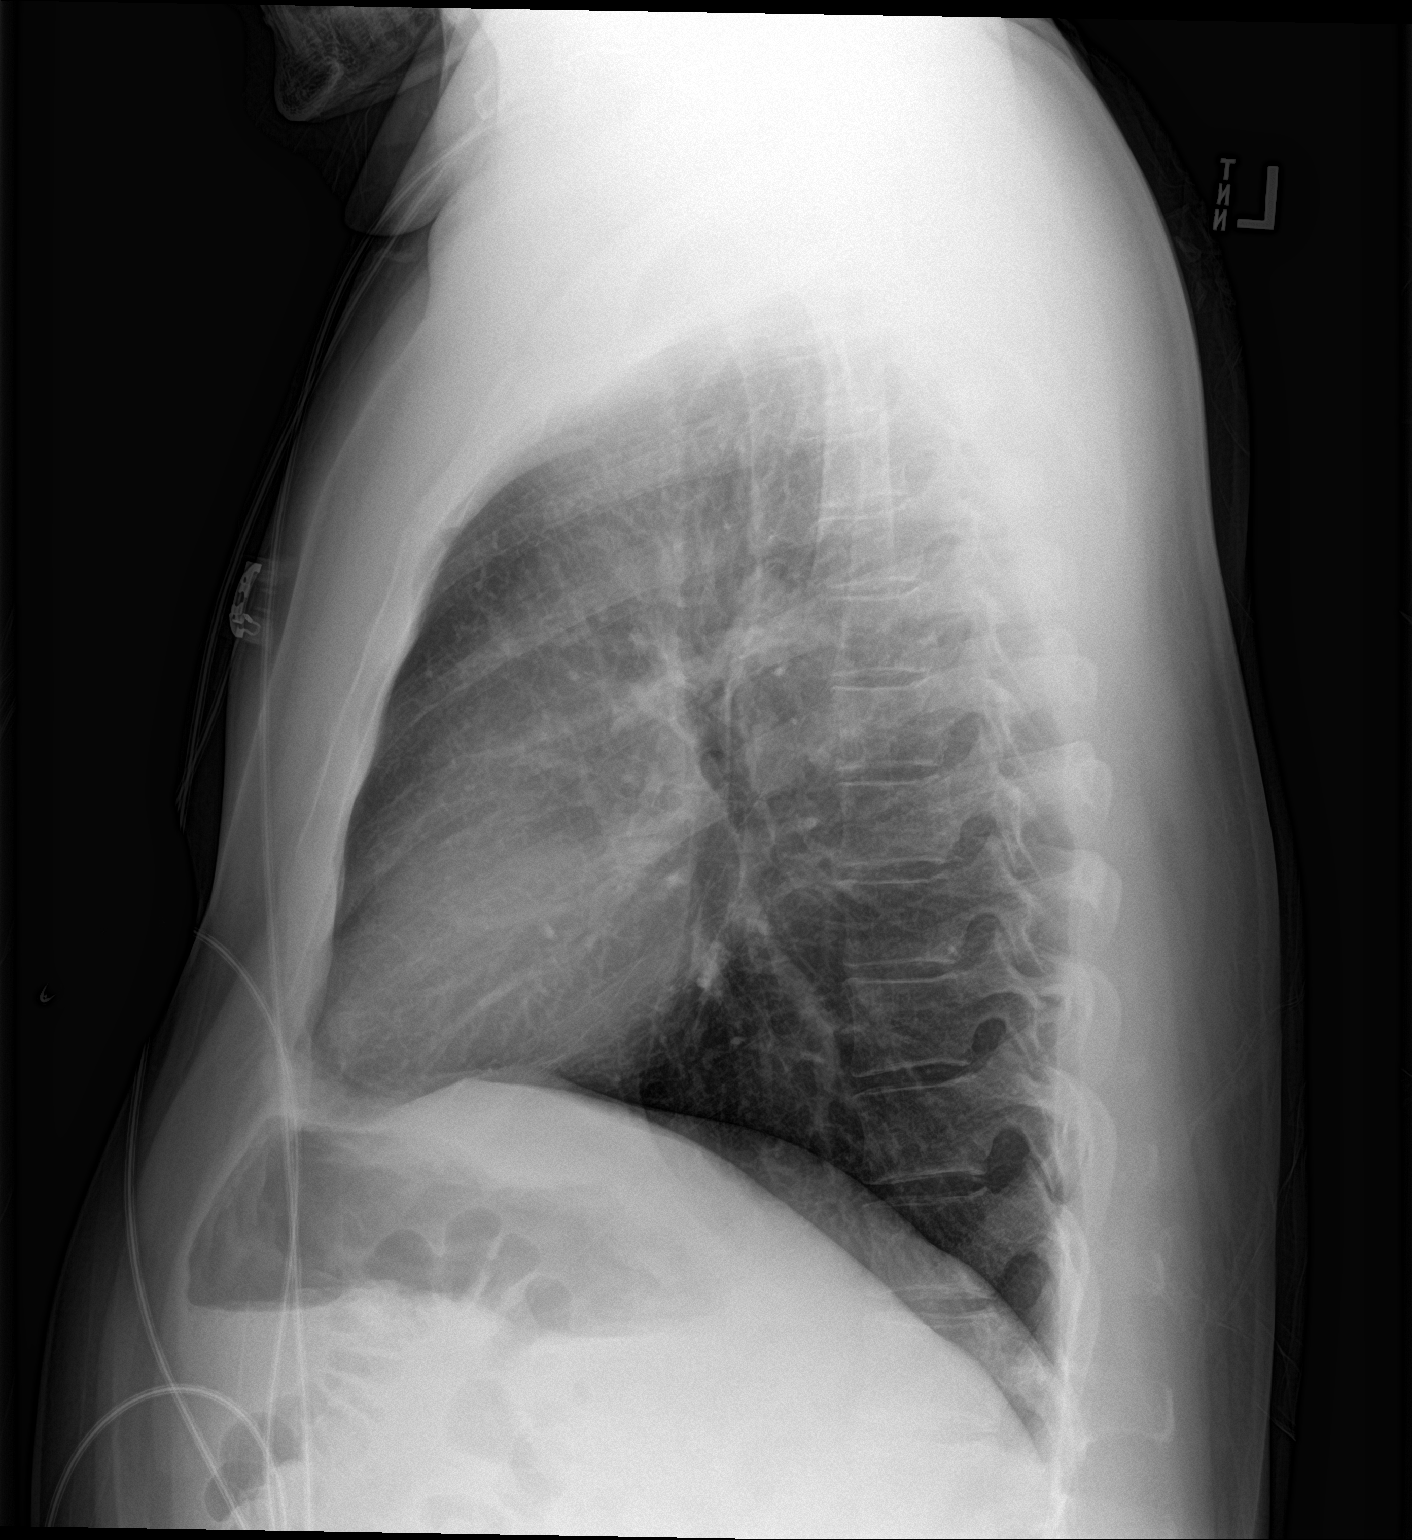

[2 of 2 positions shown; findings below may reference images not displayed]

FINDINGS: The heart size and mediastinal contours are within normal limits.
Both lungs are clear. The visualized skeletal structures are
unremarkable.
IMPRESSION: No active cardiopulmonary disease.

## 2017-08-08 ENCOUNTER — Other Ambulatory Visit: Payer: Self-pay | Admitting: Family Medicine

## 2017-08-08 ENCOUNTER — Other Ambulatory Visit: Payer: Self-pay | Admitting: Cardiovascular Disease

## 2017-08-09 ENCOUNTER — Other Ambulatory Visit: Payer: Self-pay | Admitting: Family Medicine

## 2017-08-09 ENCOUNTER — Other Ambulatory Visit: Payer: Self-pay | Admitting: Cardiovascular Disease

## 2017-08-09 MED ORDER — NITROGLYCERIN 0.4 MG SL SUBL: 0.4000 mg | SUBLINGUAL_TABLET | SUBLINGUAL | 3 refills | Status: DC | PRN

## 2017-08-09 NOTE — Telephone Encounter (Signed)
refilled ntg to [facility]

## 2017-08-09 NOTE — Telephone Encounter (Signed)
Pt pharmacy requesting refills. Please address. Thank you.

## 2017-08-09 NOTE — Telephone Encounter (Signed)
Patient requesting RX for NTG sent to [facility]

## 2017-08-23 ENCOUNTER — Other Ambulatory Visit: Payer: Self-pay | Admitting: Family Medicine

## 2017-09-12 DIAGNOSIS — Z23 Encounter for immunization: Secondary | ICD-10-CM | POA: Diagnosis not present

## 2017-09-12 DIAGNOSIS — I252 Old myocardial infarction: Secondary | ICD-10-CM | POA: Diagnosis not present

## 2017-09-12 DIAGNOSIS — I10 Essential (primary) hypertension: Secondary | ICD-10-CM | POA: Diagnosis not present

## 2017-09-12 DIAGNOSIS — E782 Mixed hyperlipidemia: Secondary | ICD-10-CM | POA: Diagnosis not present

## 2017-09-19 DIAGNOSIS — I10 Essential (primary) hypertension: Secondary | ICD-10-CM | POA: Diagnosis not present

## 2017-09-19 DIAGNOSIS — I252 Old myocardial infarction: Secondary | ICD-10-CM | POA: Diagnosis not present

## 2017-09-19 DIAGNOSIS — E782 Mixed hyperlipidemia: Secondary | ICD-10-CM | POA: Diagnosis not present

## 2017-09-26 DIAGNOSIS — E782 Mixed hyperlipidemia: Secondary | ICD-10-CM | POA: Diagnosis not present

## 2017-09-26 DIAGNOSIS — Z0000 Encounter for general adult medical examination without abnormal findings: Secondary | ICD-10-CM | POA: Diagnosis not present

## 2017-09-26 DIAGNOSIS — E559 Vitamin D deficiency, unspecified: Secondary | ICD-10-CM | POA: Diagnosis not present

## 2017-09-26 DIAGNOSIS — R7301 Impaired fasting glucose: Secondary | ICD-10-CM | POA: Diagnosis not present

## 2017-09-26 DIAGNOSIS — I252 Old myocardial infarction: Secondary | ICD-10-CM | POA: Diagnosis not present

## 2017-09-26 DIAGNOSIS — I10 Essential (primary) hypertension: Secondary | ICD-10-CM | POA: Diagnosis not present

## 2018-01-13 ENCOUNTER — Ambulatory Visit: Payer: 59 | Admitting: Cardiovascular Disease

## 2018-02-20 DIAGNOSIS — E782 Mixed hyperlipidemia: Secondary | ICD-10-CM | POA: Diagnosis not present

## 2018-02-20 DIAGNOSIS — I10 Essential (primary) hypertension: Secondary | ICD-10-CM | POA: Diagnosis not present

## 2018-02-20 DIAGNOSIS — E559 Vitamin D deficiency, unspecified: Secondary | ICD-10-CM | POA: Diagnosis not present

## 2018-02-27 DIAGNOSIS — I10 Essential (primary) hypertension: Secondary | ICD-10-CM | POA: Diagnosis not present

## 2018-02-27 DIAGNOSIS — E782 Mixed hyperlipidemia: Secondary | ICD-10-CM | POA: Diagnosis not present

## 2018-02-27 DIAGNOSIS — R945 Abnormal results of liver function studies: Secondary | ICD-10-CM | POA: Diagnosis not present

## 2018-03-13 ENCOUNTER — Ambulatory Visit: Payer: 59 | Admitting: Cardiology

## 2018-03-13 ENCOUNTER — Encounter: Payer: Self-pay | Admitting: Cardiology

## 2018-03-13 VITALS — BP 130/78 | HR 80 | Ht 66.0 in | Wt 204.0 lb

## 2018-03-13 DIAGNOSIS — I10 Essential (primary) hypertension: Secondary | ICD-10-CM

## 2018-03-13 DIAGNOSIS — E782 Mixed hyperlipidemia: Secondary | ICD-10-CM | POA: Diagnosis not present

## 2018-03-13 DIAGNOSIS — I25119 Atherosclerotic heart disease of native coronary artery with unspecified angina pectoris: Secondary | ICD-10-CM

## 2018-03-13 NOTE — Progress Notes (Signed)
Cardiology Office Note  Date: 03/13/2018   ID: [name] Sowash, DOB [birth year], MRN [id number]  PCP: Benita Stabile, MD  Evaluating Cardiologist: Nona Dell, MD   Chief Complaint  Patient presents with  . Coronary Artery Disease    History of Present Illness: Ryan [name] is a 43 y.o. male former patient of Dr. Eden Emms, last seen in the office back in October 2018 by Ms. Liborio Nixon.  This is our first meeting in the office today.  I reviewed his records and updated the chart.  He is here for a routine visit.  States that he has been doing well.  He does not report any progressive angina symptoms on medical therapy.  He works for a [employer], does lifting on a regular basis.  Also enjoys working outdoors including chopping wood.  He reports NYHA class I dyspnea, no palpitations or syncope.  We went over his medications today.  He is on a good regimen, at this point can stop Plavix, we discussed this.  Also plan to get his recent lab work from Dr. Margo Aye.  He has tolerated high-dose Lipitor but it sounds like he does have elevated triglycerides.  He is on omega-3 supplements as well.  I personally reviewed his ECG today which shows normal sinus rhythm.  Past Medical History:  Diagnosis Date  . Anxiety   . Bee sting allergy   . CAD (coronary artery disease)    Branch vessel disease involving small PDA, managed medically March 2018  . GERD (gastroesophageal reflux disease)   . Hyperlipidemia   . Hypertension   . NSTEMI (non-ST elevated myocardial infarction) (HCC) 2018  . Seasonal allergies     Past Surgical History:  Procedure Laterality Date  . LEFT HEART CATH AND CORONARY ANGIOGRAPHY N/A 05/18/2016   Procedure: Left Heart Cath and Coronary Angiography;  Surgeon: Tonny Bollman, MD;  Location: [facility] INVASIVE CV LAB;  Service: Cardiovascular;  Laterality: N/A;    Current Outpatient Medications  Medication Sig Dispense Refill  . acetaminophen (TYLENOL) 500 MG  tablet Take 1,000 mg by mouth every 6 (six) hours as needed.    Marland Kitchen amLODipine (NORVASC) 5 MG tablet TAKE 1 TABLET BY MOUTH  DAILY 90 tablet 3  . aspirin EC 81 MG EC tablet Take 1 tablet (81 mg total) by mouth daily.    Marland Kitchen atorvastatin (LIPITOR) 80 MG tablet TAKE 1 TABLET BY MOUTH  DAILY AT 6:00 PM 90 tablet 2  . cholecalciferol (VITAMIN D) 1000 units tablet Take 2,000 Units by mouth daily.    . clopidogrel (PLAVIX) 75 MG tablet TAKE 1 TABLET BY MOUTH EVERY DAY 5 tablet 0  . metoprolol succinate (TOPROL-XL) 25 MG 24 hr tablet TAKE 1 TABLET BY MOUTH AT  BEDTIME 90 tablet 3  . nitroGLYCERIN (NITROSTAT) 0.4 MG SL tablet Place 1 tablet (0.4 mg total) under the tongue every 5 (five) minutes x 3 doses as needed for chest pain. 25 tablet 3  . Omega-3 Fatty Acids (FISH OIL) 1200 MG CAPS Take by mouth 2 (two) times daily.    Marland Kitchen OVER THE COUNTER MEDICATION Take 2 tablets by mouth 2 (two) times daily. Ultra test testosterone    . ranitidine (ZANTAC) 150 MG tablet Take 150 mg by mouth as needed.     . sertraline (ZOLOFT) 100 MG tablet TAKE 1 TABLET BY MOUTH  DAILY 90 tablet 0  . zolpidem (AMBIEN) 10 MG tablet Take 1 tablet (10 mg total) by mouth at  bedtime as needed for sleep. 30 tablet 0   No current facility-administered medications for this visit.    Allergies:  Bee venom   Social History: The patient  reports that he quit smoking about 2 years ago. His smoking use included cigarettes. He smoked 0.50 packs per day. He has never used smokeless tobacco. He reports that he does not drink alcohol or use drugs.   Family History: The patient's family history includes Arthritis in his mother; Asthma in his mother; CAD in his father and paternal grandmother; Diabetes in his mother; Heart disease in his paternal grandfather and paternal grandmother; Heart disease (age of onset: 62) in his father; Hyperlipidemia in his father; Hypertension in his father.   ROS:  Please see the history of present illness. Otherwise,  complete review of systems is positive for none.  All other systems are reviewed and negative.   Physical Exam: VS:  BP 130/78 (BP Location: Right Arm)   Pulse 80   Ht 5\' 6"  (1.676 m)   Wt 204 lb (92.5 kg)   SpO2 94%   BMI 32.93 kg/m , BMI Body mass index is 32.93 kg/m.  Wt Readings from Last 3 Encounters:  03/13/18 204 lb (92.5 kg)  11/26/16 208 lb (94.3 kg)  08/08/16 192 lb 1.9 oz (87.1 kg)    General: Overweight male, appears comfortable at rest. HEENT: Conjunctiva and lids normal, oropharynx clear. Neck: Supple, no elevated JVP or carotid bruits, no thyromegaly. Lungs: Clear to auscultation, nonlabored breathing at rest. Cardiac: Regular rate and rhythm, no S3 or significant systolic murmur, no pericardial rub. Abdomen: Soft, nontender, bowel sounds present. Extremities: No pitting edema, distal pulses 2+. Skin: Warm and dry. Musculoskeletal: No kyphosis. Neuropsychiatric: Alert and oriented x3, affect grossly appropriate.  ECG: I personally reviewed the tracing from 05/17/2016 which showed sinus arrhythmia with nonspecific ST-T changes.  Recent Labwork:    Component Value Date/Time   CHOL 158 07/04/2016 0747   TRIG 146 07/04/2016 0747   HDL 34 (L) 07/04/2016 0747   CHOLHDL 4.6 07/04/2016 0747   VLDL 29 07/04/2016 0747   LDLCALC 95 07/04/2016 0747    Other Studies Reviewed Today:  Cardiac catheterization 05/18/2016: 1. 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 Mild nonobstructive CAD involving the proximal coronary vessels 3. Normal LV function  Assessment and Plan:  1.  Branch vessel CAD involving a small PDA diagnosed in 2018 in the setting of NSTEMI that was managed medically.  He is doing very well without angina symptoms on medical therapy.  Regimen is well-rounded, he will stop Plavix at this time but otherwise continue the remainder.  I encouraged weight loss, we discussed diet today.  2.  Mixed  hyperlipidemia, on high-dose Lipitor and omega-3 supplements.  Requesting recent lab work from Dr. Margo Aye.  3.  Essential hypertension, blood pressure is well controlled today.   Current medicines were reviewed with the patient today.   Orders Placed This Encounter  Procedures  . EKG 12-Lead    Disposition: Follow-up in 1 year.  Signed, Jonelle Sidle, MD, [facility] 03/13/2018 11:54 AM    [facility] at [facility] [address] [place], [place] [address] Phone: [phone]; Fax: [phone]

## 2018-03-13 NOTE — Patient Instructions (Signed)
Medication Instructions:  After you finish your current bottle of Plavix, you may stop the medication  If you need a refill on your cardiac medications before your next appointment, please call your pharmacy.   Lab work: None today If you have labs (blood work) drawn today and your tests are completely normal, you will receive your results only by: Marland Kitchen MyChart Message (if you have MyChart) OR . A paper copy in the mail If you have any lab test that is abnormal or we need to change your treatment, we will call you to review the results.  Testing/Procedures: None today  Follow-Up: At [facility], you and your health needs are our priority.  As part of our continuing mission to provide you with exceptional heart care, we have created designated Provider Care Teams.  These Care Teams include your primary Cardiologist (physician) and Advanced Practice Providers (APPs -  Physician Assistants and Nurse Practitioners) who all work together to provide you with the care you need, when you need it. You will need a follow up appointment in 12 months.  Please call our office 2 months in advance to schedule this appointment.  You may see Nona Dell, MD or one of the following Advanced Practice Providers on your designated Care Team:   Randall An, PA-C [facility]) . Jacolyn Reedy, PA-C [facility])  Any Other Special Instructions Will Be Listed Below (If Applicable). NONE

## 2018-04-19 ENCOUNTER — Other Ambulatory Visit: Payer: Self-pay | Admitting: Cardiovascular Disease

## 2018-04-30 ENCOUNTER — Other Ambulatory Visit: Payer: Self-pay | Admitting: Cardiovascular Disease

## 2018-07-10 DIAGNOSIS — E782 Mixed hyperlipidemia: Secondary | ICD-10-CM | POA: Diagnosis not present

## 2018-07-10 DIAGNOSIS — E559 Vitamin D deficiency, unspecified: Secondary | ICD-10-CM | POA: Diagnosis not present

## 2018-07-10 DIAGNOSIS — I10 Essential (primary) hypertension: Secondary | ICD-10-CM | POA: Diagnosis not present

## 2018-07-17 DIAGNOSIS — I10 Essential (primary) hypertension: Secondary | ICD-10-CM | POA: Diagnosis not present

## 2018-07-17 DIAGNOSIS — R945 Abnormal results of liver function studies: Secondary | ICD-10-CM | POA: Diagnosis not present

## 2018-07-17 DIAGNOSIS — E782 Mixed hyperlipidemia: Secondary | ICD-10-CM | POA: Diagnosis not present

## 2018-09-23 ENCOUNTER — Other Ambulatory Visit: Payer: Self-pay

## 2018-09-23 DIAGNOSIS — Z20822 Contact with and (suspected) exposure to covid-19: Secondary | ICD-10-CM

## 2018-09-24 LAB — NOVEL CORONAVIRUS, NAA: SARS-CoV-2, NAA: DETECTED — AB

## 2019-02-10 ENCOUNTER — Other Ambulatory Visit: Payer: Self-pay | Admitting: Cardiovascular Disease

## 2019-02-10 ENCOUNTER — Other Ambulatory Visit: Payer: Self-pay | Admitting: Cardiology

## 2019-05-05 ENCOUNTER — Telehealth: Payer: Self-pay

## 2019-05-05 NOTE — Telephone Encounter (Signed)
Virtual Visit Pre-Appointment Phone Call  "(Name), I am calling you today to discuss your upcoming appointment. We are currently trying to limit exposure to the virus that causes COVID-19 by seeing patients at home rather than in the office."  1. "What is the BEST phone number to call the day of the visit?" - include this in appointment notes  2. "Do you have or have access to (through a family member/friend) a smartphone with video capability that we can use for your visit?" a. If yes - list this number in appt notes as "cell" (if different from BEST phone #) and list the appointment type as a VIDEO visit in appointment notes b. If no - list the appointment type as a PHONE visit in appointment notes  3. Confirm consent - "In the setting of the current Covid19 crisis, you are scheduled for a (phone or video) visit with your provider on (date) at (time).  Just as we do with many in-office visits, in order for you to participate in this visit, we must obtain consent.  If you'd like, I can send this to your mychart (if signed up) or email for you to review.  Otherwise, I can obtain your verbal consent now.  All virtual visits are billed to your insurance company just like a normal visit would be.  By agreeing to a virtual visit, we'd like you to understand that the technology does not allow for your provider to perform an examination, and thus may limit your provider's ability to fully assess your condition. If your provider identifies any concerns that need to be evaluated in person, we will make arrangements to do so.  Finally, though the technology is pretty good, we cannot assure that it will always work on either your or our end, and in the setting of a video visit, we may have to convert it to a phone-only visit.  In either situation, we cannot ensure that we have a secure connection.  Are you willing to proceed?" STAFF: Did the patient verbally acknowledge consent to telehealth visit? Document  YES/NO here:   4. Advise patient to be prepared - "Two hours prior to your appointment, go ahead and check your blood pressure, pulse, oxygen saturation, and your weight (if you have the equipment to check those) and write them all down. When your visit starts, your provider will ask you for this information. If you have an Apple Watch or Kardia device, please plan to have heart rate information ready on the day of your appointment. Please have a pen and paper handy nearby the day of the visit as well."  5. Give patient instructions for MyChart download to smartphone OR Doximity/Doxy.me as below if video visit (depending on what platform provider is using)  6. Inform patient they will receive a phone call 15 minutes prior to their appointment time (may be from unknown caller ID) so they should be prepared to answer    TELEPHONE CALL NOTE  Ryan [name] has been deemed a candidate for a follow-up tele-health visit to limit community exposure during the Covid-19 pandemic. I spoke with the patient via phone to ensure availability of phone/video source, confirm preferred email & phone number, and discuss instructions and expectations.  I reminded Ryan [name] to be prepared with any vital sign and/or heart rhythm information that could potentially be obtained via home monitoring, at the time of his visit. I reminded Ryan [name] to expect a phone call prior to  his visit.  Dorothey Baseman 05/05/2019 4:33 PM   INSTRUCTIONS FOR DOWNLOADING THE MYCHART APP TO SMARTPHONE  - The patient must first make sure to have activated MyChart and know their login information - If Apple, go to App Store and type in MyChart in the search bar and download the app. If Android, ask patient to go to [employer] and type in [place] in the search bar and download the app. The app is free but as with any other app downloads, their phone may require them to verify saved payment information or Apple/Android password.   - The patient will need to then log into the app with their MyChart username and password, and select [facility] [place] as their healthcare provider to link the account. When it is time for your visit, go to the MyChart app, find appointments, and click Begin Video Visit. Be sure to Select Allow for your device to access the Microphone and Camera for your visit. You will then be connected, and your provider will be with you shortly.  **If they have any issues connecting, or need assistance please contact MyChart service desk [phone])**  **If using a computer, in order to ensure the best quality for their visit they will need to use either of the following Internet Browsers: [employer], or Google Chrome**  IF USING DOXIMITY or DOXY.ME - The patient will receive a link just prior to their visit by text.     FULL LENGTH CONSENT FOR TELE-HEALTH VISIT   I hereby voluntarily request, consent and authorize [facility] and its employed or contracted physicians, physician assistants, nurse practitioners or other licensed health care professionals (the Practitioner), to provide me with telemedicine health care services (the "Services") as deemed necessary by the treating Practitioner. I acknowledge and consent to receive the Services by the Practitioner via telemedicine. I understand that the telemedicine visit will involve communicating with the Practitioner through live audiovisual communication technology and the disclosure of certain medical information by electronic transmission. I acknowledge that I have been given the opportunity to request an in-person assessment or other available alternative prior to the telemedicine visit and am voluntarily participating in the telemedicine visit.  I understand that I have the right to withhold or withdraw my consent to the use of telemedicine in the course of my care at any time, without affecting my right to future care or treatment, and that  the Practitioner or I may terminate the telemedicine visit at any time. I understand that I have the right to inspect all information obtained and/or recorded in the course of the telemedicine visit and may receive copies of available information for a reasonable fee.  I understand that some of the potential risks of receiving the Services via telemedicine include:  Marland Kitchen Delay or interruption in medical evaluation due to technological equipment failure or disruption; . Information transmitted may not be sufficient (e.g. poor resolution of images) to allow for appropriate medical decision making by the Practitioner; and/or  . In rare instances, security protocols could fail, causing a breach of personal health information.  Furthermore, I acknowledge that it is my responsibility to provide information about my medical history, conditions and care that is complete and accurate to the best of my ability. I acknowledge that Practitioner's advice, recommendations, and/or decision may be based on factors not within their control, such as incomplete or inaccurate data provided by me or distortions of diagnostic images or specimens that may result from electronic transmissions. I understand  that the practice of medicine is not an exact science and that Practitioner makes no warranties or guarantees regarding treatment outcomes. I acknowledge that I will receive a copy of this consent concurrently upon execution via email to the email address I last provided but may also request a printed copy by calling the office of [facility].    I understand that my insurance will be billed for this visit.   I have read or had this consent read to me. . I understand the contents of this consent, which adequately explains the benefits and risks of the Services being provided via telemedicine.  . I have been provided ample opportunity to ask questions regarding this consent and the Services and have had my questions answered to  my satisfaction. . I give my informed consent for the services to be provided through the use of telemedicine in my medical care  By participating in this telemedicine visit I agree to the above.Marland Kitchen

## 2019-05-13 NOTE — Progress Notes (Signed)
This visit was canceled. This encounter was created in error - please disregard.

## 2019-05-14 ENCOUNTER — Encounter: Payer: Self-pay | Admitting: Cardiology

## 2019-05-28 ENCOUNTER — Ambulatory Visit (INDEPENDENT_AMBULATORY_CARE_PROVIDER_SITE_OTHER): Payer: 59 | Admitting: Cardiology

## 2019-05-28 ENCOUNTER — Other Ambulatory Visit: Payer: Self-pay

## 2019-05-28 ENCOUNTER — Encounter: Payer: Self-pay | Admitting: Cardiology

## 2019-05-28 VITALS — BP 128/76 | HR 86 | Ht 66.0 in | Wt 199.0 lb

## 2019-05-28 DIAGNOSIS — E782 Mixed hyperlipidemia: Secondary | ICD-10-CM

## 2019-05-28 DIAGNOSIS — I25119 Atherosclerotic heart disease of native coronary artery with unspecified angina pectoris: Secondary | ICD-10-CM | POA: Diagnosis not present

## 2019-05-28 NOTE — Progress Notes (Signed)
Cardiology Office Note  Date: 05/28/2019   ID: Ryan [name], DOB Mar 02, 1975, MRN [id number]  PCP:  Benita Stabile, MD  Cardiologist:  Nona Dell, MD Electrophysiologist:  None   Chief Complaint  Patient presents with  . Cardiac follow-up    History of Present Illness: Ryan [name] is a 44 y.o. male last seen in January 2020.  He presents for a follow-up visit.  States that he has been doing well, no chest pain or nitroglycerin use.  Still working full-time at a [employer].  I reviewed his medications which are outlined below.  We did talk about stopping Plavix at this point, he also mentions having worsening reflux symptoms.  We would otherwise plan to keep him on aspirin, Norvasc, Lipitor, Toprol-XL, and as needed nitroglycerin.  He just had lab work obtained by Dr. Margo Aye, results pending.  I personally reviewed his ECG today which shows normal sinus rhythm.  Past Medical History:  Diagnosis Date  . Anxiety   . Bee sting allergy   . CAD (coronary artery disease)    Branch vessel disease involving small PDA, managed medically March 2018  . GERD (gastroesophageal reflux disease)   . Hyperlipidemia   . Hypertension   . NSTEMI (non-ST elevated myocardial infarction) (HCC) 2018  . Seasonal allergies     Past Surgical History:  Procedure Laterality Date  . LEFT HEART CATH AND CORONARY ANGIOGRAPHY N/A 05/18/2016   Procedure: Left Heart Cath and Coronary Angiography;  Surgeon: Tonny Bollman, MD;  Location: [facility] INVASIVE CV LAB;  Service: Cardiovascular;  Laterality: N/A;    Current Outpatient Medications  Medication Sig Dispense Refill  . acetaminophen (TYLENOL) 500 MG tablet Take 1,000 mg by mouth every 6 (six) hours as needed.    Marland Kitchen amLODipine (NORVASC) 5 MG tablet TAKE 1 TABLET BY MOUTH  DAILY 90 tablet 3  . aspirin EC 81 MG EC tablet Take 1 tablet (81 mg total) by mouth daily.    Marland Kitchen atorvastatin (LIPITOR) 80 MG tablet TAKE 1 TABLET BY MOUTH  DAILY AT 6:00 PM  90 tablet 3  . cholecalciferol (VITAMIN D) 1000 units tablet Take 2,000 Units by mouth daily.    . clopidogrel (PLAVIX) 75 MG tablet TAKE 1 TABLET BY MOUTH  DAILY WITH BREAKFAST 90 tablet 3  . metoprolol succinate (TOPROL-XL) 25 MG 24 hr tablet TAKE 1 TABLET BY MOUTH AT  BEDTIME 90 tablet 3  . nitroGLYCERIN (NITROSTAT) 0.4 MG SL tablet DISSOLVE 1 TABLET UNDER  TONGUE EVERY 5 MINUTES AS  NEEDED FOR CHEST PAIN. MAX  3 TABLETS IN 15 MINUTES.  CALL 911 IF PAIN PERSISTS 25 tablet 3  . Omega-3 Fatty Acids (FISH OIL) 1200 MG CAPS Take by mouth 2 (two) times daily.    Marland Kitchen zolpidem (AMBIEN) 10 MG tablet Take 1 tablet (10 mg total) by mouth at bedtime as needed for sleep. 30 tablet 0   No current facility-administered medications for this visit.   Allergies:  Bee venom   ROS:   No palpitations or syncope.  Physical Exam: VS:  BP 128/76   Pulse 86   Ht 5\' 6"  (1.676 m)   Wt 199 lb (90.3 kg)   SpO2 96%   BMI 32.12 kg/m , BMI Body mass index is 32.12 kg/m.  Wt Readings from Last 3 Encounters:  05/28/19 199 lb (90.3 kg)  03/13/18 204 lb (92.5 kg)  11/26/16 208 lb (94.3 kg)    General: Patient appears comfortable at rest.  HEENT: Conjunctiva and lids normal, wearing a mask. Neck: Supple, no elevated JVP or carotid bruits, no thyromegaly. Lungs: Clear to auscultation, nonlabored breathing at rest. Cardiac: Regular rate and rhythm, no S3 or significant systolic murmur, no pericardial rub. Abdomen: Soft, bowel sounds present, no guarding or rebound. Extremities: No pitting edema, distal pulses 2+.  ECG:  An ECG dated 03/13/2018 was personally reviewed today and demonstrated:  Normal sinus rhythm.  Recent Labwork:    Component Value Date/Time   CHOL 158 07/04/2016 0747   TRIG 146 07/04/2016 0747   HDL 34 (L) 07/04/2016 0747   CHOLHDL 4.6 07/04/2016 0747   VLDL 29 07/04/2016 0747   LDLCALC 95 07/04/2016 0747    Other Studies Reviewed Today:  Cardiac catheterization 05/18/2016: 1. Severe  stenosis of a small PDA branch, likely culprit lesion for the patient's NSTEMI 2. Mild nonobstructive CAD involving the proximal coronary vessels 3. Normal LV function  Assessment and Plan:  1.  Branch vessel CAD involving small PDA back in 2018 with NSTEMI.  He has done very well without recurring symptoms.  Plan to stop Plavix particularly with worsening reflux symptoms.  Continue aspirin, Lipitor, Norvasc, Toprol-XL, and as needed nitroglycerin.  2.  Mixed hyperlipidemia, remains on Lipitor, no intolerances.  Follow-up recent lab work when results available.  Goal LDL should be under 70.  Medication Adjustments/Labs and Tests Ordered: Current medicines are reviewed at length with the patient today.  Concerns regarding medicines are outlined above.   Tests Ordered: No orders of the defined types were placed in this encounter.   Medication Changes: No orders of the defined types were placed in this encounter.   Disposition:  Follow up 1 year in the [place] office.  Signed, Satira Sark, MD, [facility] 05/28/2019 2:28 PM    [facility] at [facility] [address], [place], [place] [address] Phone: [phone]; Fax: [phone]

## 2019-05-28 NOTE — Addendum Note (Signed)
Addended by: Kerney Elbe on: 05/28/2019 03:00 PM   Modules accepted: Orders

## 2019-05-28 NOTE — Patient Instructions (Signed)
Medication Instructions:  Stop Plavix   Labwork: NONE   Testing/Procedures: NONE   Follow-Up: Your physician wants you to follow-up in: 1 Year with Dr. Diona Browner.  You will receive a reminder letter in the mail two months in advance. If you don't receive a letter, please call our office to schedule the follow-up appointment.   Any Other Special Instructions Will Be Listed Below (If Applicable).     If you need a refill on your cardiac medications before your next appointment, please call your pharmacy. Thank you for choosing [facility]!

## 2019-07-23 ENCOUNTER — Telehealth: Payer: Self-pay | Admitting: Cardiology

## 2019-07-23 MED ORDER — ATORVASTATIN CALCIUM 80 MG PO TABS: ORAL_TABLET | ORAL | 3 refills | Status: DC

## 2019-07-23 MED ORDER — METOPROLOL SUCCINATE ER 25 MG PO TB24: 25.0000 mg | ORAL_TABLET | Freq: Every day | ORAL | 3 refills | Status: DC

## 2019-07-23 MED ORDER — AMLODIPINE BESYLATE 5 MG PO TABS: 5.0000 mg | ORAL_TABLET | Freq: Every day | ORAL | 3 refills | Status: DC

## 2019-07-23 NOTE — Telephone Encounter (Signed)
Pharmacy changed refilled all cardiac meds

## 2019-07-23 NOTE — Telephone Encounter (Signed)
Pt stated he's changed pharmacy to [facility] mail order   [phone] tele #

## 2019-10-28 ENCOUNTER — Other Ambulatory Visit: Payer: Self-pay | Admitting: Cardiology

## 2019-10-28 MED ORDER — AMLODIPINE BESYLATE 5 MG PO TABS
5.0000 mg | ORAL_TABLET | Freq: Every day | ORAL | 3 refills | Status: DC
Start: 2019-10-28 — End: 2019-11-03

## 2019-10-28 MED ORDER — ATORVASTATIN CALCIUM 80 MG PO TABS: ORAL_TABLET | ORAL | 3 refills | Status: DC

## 2019-10-28 MED ORDER — METOPROLOL SUCCINATE ER 25 MG PO TB24: 25.0000 mg | ORAL_TABLET | Freq: Every day | ORAL | 3 refills | Status: DC

## 2019-10-28 NOTE — Telephone Encounter (Signed)
New message     *STAT* If patient is at the pharmacy, call can be transferred to refill team.   1. Which medications need to be refilled? (please list name of each medication and dose if known) metoprolol succinate (TOPROL-XL) 25 MG 24 hr tablet atorvastatin (LIPITOR) 80 MG tablet amLODipine (NORVASC) 5 MG tablet 2. Which pharmacy/location (including street and city if local pharmacy) is medication to be sent to [facility] mail order   3. Do they need a 30 day or 90 day supply? 90

## 2019-10-28 NOTE — Telephone Encounter (Signed)
Refilled to [facility] as requested

## 2019-11-03 ENCOUNTER — Telehealth: Payer: Self-pay | Admitting: Cardiology

## 2019-11-03 MED ORDER — ATORVASTATIN CALCIUM 80 MG PO TABS: ORAL_TABLET | ORAL | 3 refills | Status: DC

## 2019-11-03 MED ORDER — METOPROLOL SUCCINATE ER 25 MG PO TB24: 25.0000 mg | ORAL_TABLET | Freq: Every day | ORAL | 3 refills | Status: DC

## 2019-11-03 MED ORDER — NITROGLYCERIN 0.4 MG SL SUBL: SUBLINGUAL_TABLET | SUBLINGUAL | 3 refills | Status: DC

## 2019-11-03 MED ORDER — AMLODIPINE BESYLATE 5 MG PO TABS
5.0000 mg | ORAL_TABLET | Freq: Every day | ORAL | 3 refills | Status: DC
Start: 2019-11-03 — End: 2020-11-07

## 2019-11-03 NOTE — Telephone Encounter (Signed)
New message      *STAT* If patient is at the pharmacy, call can be transferred to refill team.   1. Which medications need to be refilled? (please list name of each medication and dose if known) Send all prescriptions to [facility] - no longer using [facility]  2. Which pharmacy/location (including street and city if local pharmacy) [facility]   3. Do they need a 30 day or 90 day supply? 90 day

## 2019-11-03 NOTE — Telephone Encounter (Signed)
Refills sent

## 2020-11-07 ENCOUNTER — Other Ambulatory Visit: Payer: Self-pay | Admitting: Cardiology

## 2020-12-12 NOTE — Progress Notes (Addendum)
Cardiology Office Note  Date: 12/13/2020   ID: [name] Bagent, DOB 1975-03-07, MRN [id number]  PCP:  Benita Stabile, MD  Cardiologist:  Nona Dell, MD Electrophysiologist:  None   Chief Complaint: Medication management  History of Present Illness: Ryan [name] is a 45 y.o. male with a history of CAD, NSTEMI, 8 TN, HLD, GERD.  He was last seen by Dr. Diona Browner on 05/28/2019.  He had been doing well with no chest pain or nitroglycerin use.  Discontinuing Plavix was discussed.  Plan was to keep him on aspirin, Norvasc, Lipitor, Toprol-XL and as needed nitroglycerin.  He was remaining on Lipitor without intolerance. Goal LDL less than 70.  Follow-up recent lab work when results available.  He is here today for 1 year follow-up and medication management.  He states he recently ran out of his amlodipine which is the reason for his elevated blood pressure today.  He denies any recent acute illness or hospitalizations in the interim since last visit.  He states he did have a COVID last year with some mild symptoms.  He denies any current anginal symptoms or nitroglycerin use.  No DOE or SOB no orthostatic symptoms, PND, orthopnea, CVA or TIA-like symptoms.  He does smoke but states he plans to quit.  He states he is practicing a vegan diet.  Past Medical History:  Diagnosis Date   Anxiety    Bee sting allergy    CAD (coronary artery disease)    Branch vessel disease involving small PDA, managed medically March 2018   GERD (gastroesophageal reflux disease)    Hyperlipidemia    Hypertension    NSTEMI (non-ST elevated myocardial infarction) (HCC) 2018   Seasonal allergies     Past Surgical History:  Procedure Laterality Date   LEFT HEART CATH AND CORONARY ANGIOGRAPHY N/A 05/18/2016   Procedure: Left Heart Cath and Coronary Angiography;  Surgeon: Tonny Bollman, MD;  Location: [facility] INVASIVE CV LAB;  Service: Cardiovascular;  Laterality: N/A;    Current Outpatient Medications  Medication Sig  Dispense Refill   acetaminophen (TYLENOL) 500 MG tablet Take 1,000 mg by mouth every 6 (six) hours as needed.     amLODipine (NORVASC) 5 MG tablet TAKE 1 TABLET DAILY 30 tablet 0   aspirin EC 81 MG EC tablet Take 1 tablet (81 mg total) by mouth daily.     atorvastatin (LIPITOR) 80 MG tablet TAKE 1 TABLET BY MOUTH  DAILY AT 6:00 PM 90 tablet 3   cetirizine (ZYRTEC) 10 MG tablet Take 10 mg by mouth 3 (three) times a week.     metoprolol succinate (TOPROL-XL) 25 MG 24 hr tablet Take 1 tablet (25 mg total) by mouth at bedtime. 90 tablet 3   nitroGLYCERIN (NITROSTAT) 0.4 MG SL tablet DISSOLVE 1 TABLET UNDER  TONGUE EVERY 5 MINUTES AS  NEEDED FOR CHEST PAIN. MAX  3 TABLETS IN 15 MINUTES.  CALL 911 IF PAIN PERSISTS 25 tablet 3   omeprazole (PRILOSEC) 20 MG capsule Take 20 mg by mouth daily.     zolpidem (AMBIEN) 10 MG tablet Take 1 tablet (10 mg total) by mouth at bedtime as needed for sleep. 30 tablet 0   No current facility-administered medications for this visit.   Allergies:  Bee venom   Social History: The patient  reports that he has been smoking cigarettes. He has been smoking an average of .5 packs per day. He has never used smokeless tobacco. He reports that he does not drink  alcohol and does not use drugs.   Family History: The patient's family history includes Arthritis in his mother; Asthma in his mother; CAD in his father and paternal grandmother; Diabetes in his mother; Heart disease in his paternal grandfather and paternal grandmother; Heart disease (age of onset: 70) in his father; Hyperlipidemia in his father; Hypertension in his father.   ROS:  Please see the history of present illness. Otherwise, complete review of systems is positive for none.  All other systems are reviewed and negative.   Physical Exam: VS:  BP 132/90 Comment: did not take amlodipine this morning  Pulse 73   Ht 5\' 6"  (1.676 m)   Wt 187 lb 12.8 oz (85.2 kg)   SpO2 98%   BMI 30.31 kg/m , BMI Body mass index  is 30.31 kg/m.  Wt Readings from Last 3 Encounters:  12/13/20 187 lb 12.8 oz (85.2 kg)  05/28/19 199 lb (90.3 kg)  03/13/18 204 lb (92.5 kg)    General: Patient appears comfortable at rest. Neck: Supple, no elevated JVP or carotid bruits, no thyromegaly. Lungs: Clear to auscultation, nonlabored breathing at rest. Cardiac: Regular rate and rhythm, no S3 or significant systolic murmur, no pericardial rub. Extremities: No pitting edema, distal pulses 2+. Skin: Warm and dry. Musculoskeletal: No kyphosis. Neuropsychiatric: Alert and oriented x3, affect grossly appropriate.  ECG: 12/13/2020 normal sinus rhythm rate of 62  Recent Labwork: No results found for requested labs within last 8760 hours.     Component Value Date/Time   CHOL 158 07/04/2016 0747   TRIG 146 07/04/2016 0747   HDL 34 (L) 07/04/2016 0747   CHOLHDL 4.6 07/04/2016 0747   VLDL 29 07/04/2016 0747   LDLCALC 95 07/04/2016 0747    Other Studies Reviewed Today:    Cardiac catheterization 05/18/2016: 1. 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 Mild nonobstructive CAD involving the proximal coronary vessels 3. Normal LV function  Assessment and Plan:  1. CAD in native artery   2. Mixed hyperlipidemia   3. Essential hypertension, benign    1. CAD in native artery Denies any recent anginal or exertional symptoms or nitroglycerin use.  Continue sublingual nitroglycerin as needed, aspirin 81 mg daily,  2. Mixed hyperlipidemia Continue atorvastatin 80 mg p.o. daily.  Please refill Lipitor  3.  Essential hypertension Blood pressure 132/90 today.  Patient states he ran out of his amlodipine yesterday.  He is here for refills.  Please refill all antihypertensive medications as well as antianginals and Lipitor.  Continue amlodipine 5 mg daily, Toprol-XL 25 mg daily.  Medication Adjustments/Labs and Tests Ordered: Current medicines are reviewed at length with the patient  today.  Concerns regarding medicines are outlined above.   Disposition: Follow-up with Dr. 05/20/2016 or APP 1 year  Signed, Diona Browner, NP 12/13/2020 9:42 AM    [facility] at [facility] [address] [place], [place], [place] Phone: [phone]; Fax: [phone]

## 2020-12-13 ENCOUNTER — Encounter: Payer: Self-pay | Admitting: Family Medicine

## 2020-12-13 ENCOUNTER — Ambulatory Visit: Payer: BC Managed Care – PPO | Admitting: Family Medicine

## 2020-12-13 VITALS — BP 132/90 | HR 73 | Ht 66.0 in | Wt 187.8 lb

## 2020-12-13 DIAGNOSIS — I2510 Atherosclerotic heart disease of native coronary artery without angina pectoris: Secondary | ICD-10-CM

## 2020-12-13 DIAGNOSIS — I10 Essential (primary) hypertension: Secondary | ICD-10-CM | POA: Diagnosis not present

## 2020-12-13 DIAGNOSIS — E782 Mixed hyperlipidemia: Secondary | ICD-10-CM

## 2020-12-13 MED ORDER — ATORVASTATIN CALCIUM 80 MG PO TABS: 80.0000 mg | ORAL_TABLET | Freq: Every evening | ORAL | 3 refills | Status: DC

## 2020-12-13 MED ORDER — AMLODIPINE BESYLATE 5 MG PO TABS
5.0000 mg | ORAL_TABLET | Freq: Every day | ORAL | 3 refills | Status: DC
Start: 2020-12-13 — End: 2020-12-13

## 2020-12-13 MED ORDER — AMLODIPINE BESYLATE 5 MG PO TABS: 5.0000 mg | ORAL_TABLET | Freq: Every day | ORAL | 1 refills | Status: DC

## 2020-12-13 MED ORDER — METOPROLOL SUCCINATE ER 25 MG PO TB24: 25.0000 mg | ORAL_TABLET | Freq: Every day | ORAL | 3 refills | Status: DC

## 2020-12-13 NOTE — Patient Instructions (Signed)

## 2021-01-03 ENCOUNTER — Ambulatory Visit: Payer: BC Managed Care – PPO | Admitting: Cardiology

## 2021-04-05 ENCOUNTER — Telehealth: Payer: Self-pay | Admitting: Cardiology

## 2021-04-05 ENCOUNTER — Other Ambulatory Visit: Payer: Self-pay

## 2021-04-05 MED ORDER — AMLODIPINE BESYLATE 5 MG PO TABS: 5.0000 mg | ORAL_TABLET | Freq: Every day | ORAL | 1 refills | Status: DC

## 2021-04-05 MED ORDER — METOPROLOL SUCCINATE ER 25 MG PO TB24: 25.0000 mg | ORAL_TABLET | Freq: Every day | ORAL | 1 refills | Status: DC

## 2021-04-05 MED ORDER — ATORVASTATIN CALCIUM 80 MG PO TABS: 80.0000 mg | ORAL_TABLET | Freq: Every evening | ORAL | 1 refills | Status: DC

## 2021-04-05 NOTE — Telephone Encounter (Signed)
° ° °*  STAT* If patient is at the pharmacy, call can be transferred to refill team.   1. Which medications need to be refilled? (please list name of each medication and dose if known)   amLODipine (NORVASC) 5 MG tablet    atorvastatin (LIPITOR) 80 MG tablet    metoprolol succinate (TOPROL-XL) 25 MG 24 hr tablet    2. Which pharmacy/location (including street and city if local pharmacy) is medication to be sent to? [facility] - [place], [place] - [address]  3. Do they need a 30 day or 90 day supply? 90 days

## 2021-04-10 MED ORDER — METOPROLOL SUCCINATE ER 25 MG PO TB24: 25.0000 mg | ORAL_TABLET | Freq: Every day | ORAL | 1 refills | Status: DC

## 2021-04-10 MED ORDER — AMLODIPINE BESYLATE 5 MG PO TABS: 5.0000 mg | ORAL_TABLET | Freq: Every day | ORAL | 1 refills | Status: DC

## 2021-04-10 MED ORDER — ATORVASTATIN CALCIUM 80 MG PO TABS: 80.0000 mg | ORAL_TABLET | Freq: Every evening | ORAL | 1 refills | Status: DC

## 2021-04-10 NOTE — Telephone Encounter (Signed)
Done

## 2021-04-10 NOTE — Addendum Note (Signed)
Addended by: Lesle Chris on: 04/10/2021 10:04 AM   Modules accepted: Orders

## 2021-04-10 NOTE — Telephone Encounter (Signed)
Pt is needing these Rx's sent to [facility]  He has not set up his ins [facility] and is needing those to be cancelled. 3

## 2021-06-30 ENCOUNTER — Telehealth: Payer: Self-pay | Admitting: Cardiology

## 2021-06-30 MED ORDER — OMEPRAZOLE 20 MG PO CPDR: 20.0000 mg | DELAYED_RELEASE_CAPSULE | Freq: Every day | ORAL | 1 refills | Status: AC

## 2021-06-30 MED ORDER — NITROGLYCERIN 0.4 MG SL SUBL: SUBLINGUAL_TABLET | SUBLINGUAL | 3 refills | Status: AC

## 2021-06-30 MED ORDER — AMLODIPINE BESYLATE 5 MG PO TABS: 5.0000 mg | ORAL_TABLET | Freq: Every day | ORAL | 1 refills | Status: DC

## 2021-06-30 MED ORDER — ATORVASTATIN CALCIUM 80 MG PO TABS: 80.0000 mg | ORAL_TABLET | Freq: Every evening | ORAL | 1 refills | Status: DC

## 2021-06-30 MED ORDER — METOPROLOL SUCCINATE ER 25 MG PO TB24: 25.0000 mg | ORAL_TABLET | Freq: Every day | ORAL | 1 refills | Status: DC

## 2021-06-30 NOTE — Telephone Encounter (Signed)
Completed.

## 2021-06-30 NOTE — Telephone Encounter (Signed)
? ?*  STAT* If patient is at the pharmacy, call can be transferred to refill team. ? ? ?1. Which medications need to be refilled? (please list name of each medication and dose if known) amLODipine (NORVASC) 5 MG tablet ?atorvastatin (LIPITOR) 80 MG tablet ?metoprolol succinate (TOPROL-XL) 25 MG 24 hr tablet ?nitroGLYCERIN (NITROSTAT) 0.4 MG SL tablet ?omeprazole (PRILOSEC) 20 MG capsule ? ?2. Which pharmacy/location (including street and city if local pharmacy) is medication to be sent to? [facility] [place] [place] ? ?3. Do they need a 30 day or 90 day supply? 90 days ?

## 2021-07-13 ENCOUNTER — Other Ambulatory Visit: Payer: Self-pay

## 2021-07-14 ENCOUNTER — Other Ambulatory Visit: Payer: Self-pay

## 2021-07-14 MED ORDER — AMLODIPINE BESYLATE 5 MG PO TABS: 5.0000 mg | ORAL_TABLET | Freq: Every day | ORAL | 1 refills | Status: DC

## 2021-07-14 MED ORDER — METOPROLOL SUCCINATE ER 25 MG PO TB24: 25.0000 mg | ORAL_TABLET | Freq: Every day | ORAL | 1 refills | Status: DC

## 2021-07-14 NOTE — Telephone Encounter (Signed)
Pts mail [facility], e-scribed amlodipine 5 mg qd,#90,RF:1

## 2021-07-27 ENCOUNTER — Telehealth: Payer: Self-pay | Admitting: Cardiology

## 2021-07-27 MED ORDER — ATORVASTATIN CALCIUM 80 MG PO TABS: 80.0000 mg | ORAL_TABLET | Freq: Every evening | ORAL | 1 refills | Status: DC

## 2021-07-27 MED ORDER — ATORVASTATIN CALCIUM 80 MG PO TABS
80.0000 mg | ORAL_TABLET | Freq: Every evening | ORAL | 1 refills | Status: DC
Start: 2021-07-27 — End: 2021-11-23

## 2021-07-27 NOTE — Telephone Encounter (Signed)
Done

## 2021-07-27 NOTE — Telephone Encounter (Signed)
*  STAT* If patient is at the pharmacy, call can be transferred to refill team.   1. Which medications need to be refilled? (please list name of each medication and dose if known)  atorvastatin (LIPITOR) 80 MG tablet  2. Which pharmacy/location (including street and city if local pharmacy) is medication to be sent to? [facility] - [place], [place] - [address]  3. Do they need a 30 day or 90 day supply?   Patient states original Rx was sent to the wrong pharmacy. He states he has been out of Atorvastatin for 2 weeks and would like to have a 90 day supply sent through [facility] and an emergency supply sent to his local [facility].

## 2021-11-09 ENCOUNTER — Other Ambulatory Visit: Payer: Self-pay | Admitting: Cardiology

## 2021-11-22 ENCOUNTER — Other Ambulatory Visit: Payer: Self-pay | Admitting: Cardiology

## 2021-12-06 ENCOUNTER — Other Ambulatory Visit: Payer: Self-pay | Admitting: Cardiology

## 2022-02-08 ENCOUNTER — Other Ambulatory Visit: Payer: Self-pay | Admitting: Cardiology

## 2022-03-15 ENCOUNTER — Ambulatory Visit: Payer: BC Managed Care – PPO | Admitting: Nurse Practitioner

## 2022-03-27 ENCOUNTER — Telehealth: Payer: Self-pay | Admitting: Cardiology

## 2022-03-27 NOTE — Telephone Encounter (Signed)
FYI.  °Contacted patient regarding recall appointment, patient notified our office they did not wish to keep this appointment at this time.  Deleted recall from system. °

## 2022-04-17 ENCOUNTER — Other Ambulatory Visit: Payer: Self-pay | Admitting: Cardiology

## 2022-06-27 ENCOUNTER — Other Ambulatory Visit: Payer: Self-pay | Admitting: Cardiology

## 2022-06-28 ENCOUNTER — Other Ambulatory Visit: Payer: Self-pay | Admitting: Cardiology

## 2022-06-28 ENCOUNTER — Telehealth: Payer: Self-pay | Admitting: Cardiology

## 2022-06-28 NOTE — Telephone Encounter (Signed)
5/09 No Answer - needs appt with Sharlene Dory, NP - for refills of medications

## 2022-08-11 ENCOUNTER — Other Ambulatory Visit: Payer: Self-pay | Admitting: Cardiology

## 2022-08-20 ENCOUNTER — Other Ambulatory Visit: Payer: Self-pay | Admitting: Cardiology

## 2022-09-21 ENCOUNTER — Other Ambulatory Visit: Payer: Self-pay | Admitting: Cardiology

## 2023-07-24 ENCOUNTER — Ambulatory Visit: Payer: Self-pay

## 2023-07-24 DIAGNOSIS — D125 Benign neoplasm of sigmoid colon: Secondary | ICD-10-CM | POA: Diagnosis not present

## 2023-07-24 DIAGNOSIS — D124 Benign neoplasm of descending colon: Secondary | ICD-10-CM | POA: Diagnosis not present

## 2023-07-24 DIAGNOSIS — D122 Benign neoplasm of ascending colon: Secondary | ICD-10-CM | POA: Diagnosis not present

## 2023-07-24 DIAGNOSIS — K621 Rectal polyp: Secondary | ICD-10-CM | POA: Diagnosis not present

## 2023-07-24 DIAGNOSIS — Z1211 Encounter for screening for malignant neoplasm of colon: Secondary | ICD-10-CM | POA: Diagnosis present
